# Patient Record
Sex: Male | Born: 1970
Health system: Southern US, Community
[De-identification: ages and names within clinical notes are randomized; demographics above are authoritative.]

## PROBLEM LIST (undated history)

## (undated) DIAGNOSIS — R002 Palpitations: Secondary | ICD-10-CM

## (undated) DIAGNOSIS — R42 Dizziness and giddiness: Secondary | ICD-10-CM

## (undated) DIAGNOSIS — M25562 Pain in left knee: Secondary | ICD-10-CM

## (undated) DIAGNOSIS — G4730 Sleep apnea, unspecified: Secondary | ICD-10-CM

## (undated) HISTORY — DX: Pain in left knee: M25.562

## (undated) HISTORY — DX: Dizziness and giddiness: R42

## (undated) HISTORY — PX: INGUINAL HERNIA REPAIR: SUR1180

## (undated) HISTORY — PX: NASAL SEPTUM SURGERY: SHX37

## (undated) HISTORY — DX: Sleep apnea, unspecified: G47.30

## (undated) HISTORY — DX: Palpitations: R00.2

---

## 2004-04-25 ENCOUNTER — Ambulatory Visit: Payer: Self-pay | Admitting: Internal Medicine

## 2004-04-26 ENCOUNTER — Ambulatory Visit: Payer: Self-pay | Admitting: Internal Medicine

## 2004-04-29 ENCOUNTER — Ambulatory Visit: Payer: Self-pay | Admitting: Internal Medicine

## 2005-04-26 ENCOUNTER — Ambulatory Visit: Payer: Self-pay | Admitting: General Practice

## 2005-06-09 ENCOUNTER — Ambulatory Visit: Payer: Self-pay | Admitting: Unknown Physician Specialty

## 2005-09-18 ENCOUNTER — Ambulatory Visit: Payer: Self-pay | Admitting: General Practice

## 2007-07-13 ENCOUNTER — Ambulatory Visit: Payer: Self-pay | Admitting: Internal Medicine

## 2007-08-21 ENCOUNTER — Ambulatory Visit: Payer: Self-pay | Admitting: Internal Medicine

## 2008-10-06 ENCOUNTER — Ambulatory Visit: Payer: Self-pay | Admitting: Internal Medicine

## 2008-10-09 ENCOUNTER — Ambulatory Visit: Payer: Self-pay | Admitting: Internal Medicine

## 2008-10-21 ENCOUNTER — Ambulatory Visit: Payer: Self-pay | Admitting: Family Medicine

## 2009-07-02 ENCOUNTER — Ambulatory Visit: Payer: Self-pay | Admitting: Internal Medicine

## 2011-03-07 ENCOUNTER — Ambulatory Visit: Payer: Self-pay | Admitting: Podiatry

## 2013-04-22 ENCOUNTER — Emergency Department: Payer: Self-pay | Admitting: Emergency Medicine

## 2013-04-22 LAB — BASIC METABOLIC PANEL
BUN: 14 mg/dL (ref 7–18)
Calcium, Total: 9 mg/dL (ref 8.5–10.1)
Creatinine: 1.25 mg/dL (ref 0.60–1.30)
EGFR (African American): >60
Glucose: 137 mg/dL — ABNORMAL HIGH (ref 65–99)
Osmolality: 278 (ref 275–301)
Potassium: 3.7 mmol/L (ref 3.5–5.1)

## 2013-04-22 LAB — CBC
HGB: 14.4 g/dL (ref 13.0–18.0)
MCV: 92 fL (ref 80–100)
WBC: 8.2 10*3/uL (ref 3.8–10.6)

## 2013-04-22 LAB — TROPONIN I: Troponin-I: <0.02 ng/mL

## 2013-04-23 LAB — TROPONIN I: Troponin-I: <0.02 ng/mL

## 2013-06-20 ENCOUNTER — Ambulatory Visit: Payer: Self-pay | Admitting: Podiatry

## 2013-09-14 ENCOUNTER — Ambulatory Visit: Payer: Self-pay | Admitting: Physician Assistant

## 2013-09-14 LAB — RAPID INFLUENZA A&B ANTIGENS

## 2014-02-27 DIAGNOSIS — G4730 Sleep apnea, unspecified: Secondary | ICD-10-CM | POA: Insufficient documentation

## 2014-02-27 DIAGNOSIS — E785 Hyperlipidemia, unspecified: Secondary | ICD-10-CM | POA: Insufficient documentation

## 2014-02-27 DIAGNOSIS — K219 Gastro-esophageal reflux disease without esophagitis: Secondary | ICD-10-CM | POA: Insufficient documentation

## 2014-08-23 IMAGING — CR DG CHEST 2V
1 series · 2 of 2 positions shown · non-contrast
Comparison: none

REASON FOR EXAM: Chest Pain
COMMENTS:

PROCEDURE:     DXR - DXR CHEST PA (OR AP) AND LATERAL  - April 22, 2013  [DATE]
RESULT:     The lungs are clear. The cardiac silhouette and visualized bony
skeleton are unremarkable.

[Series 1: w chest pa · 0.14mm/px · 2 of 2 slices shown]
[im 1/2]
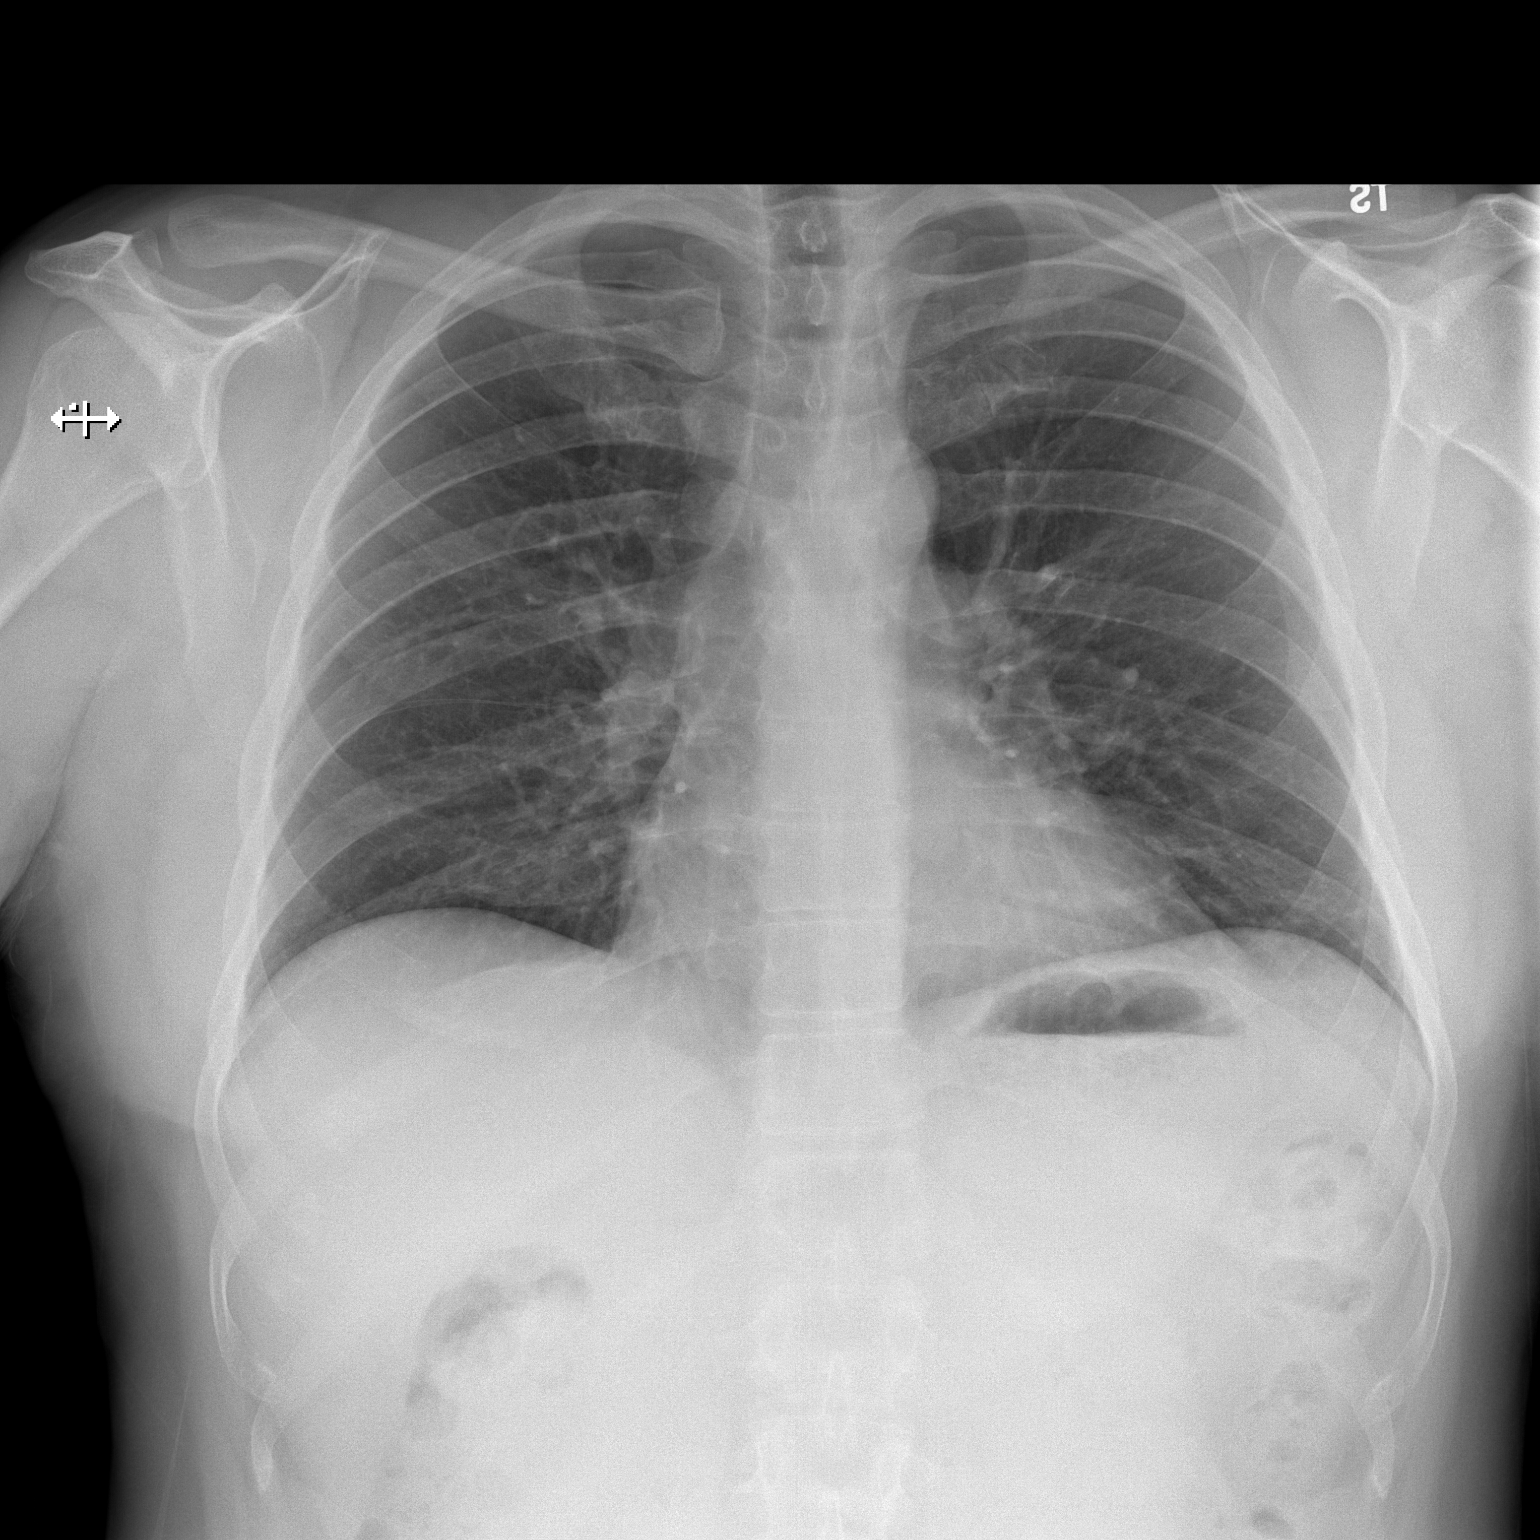
[im 2/2]
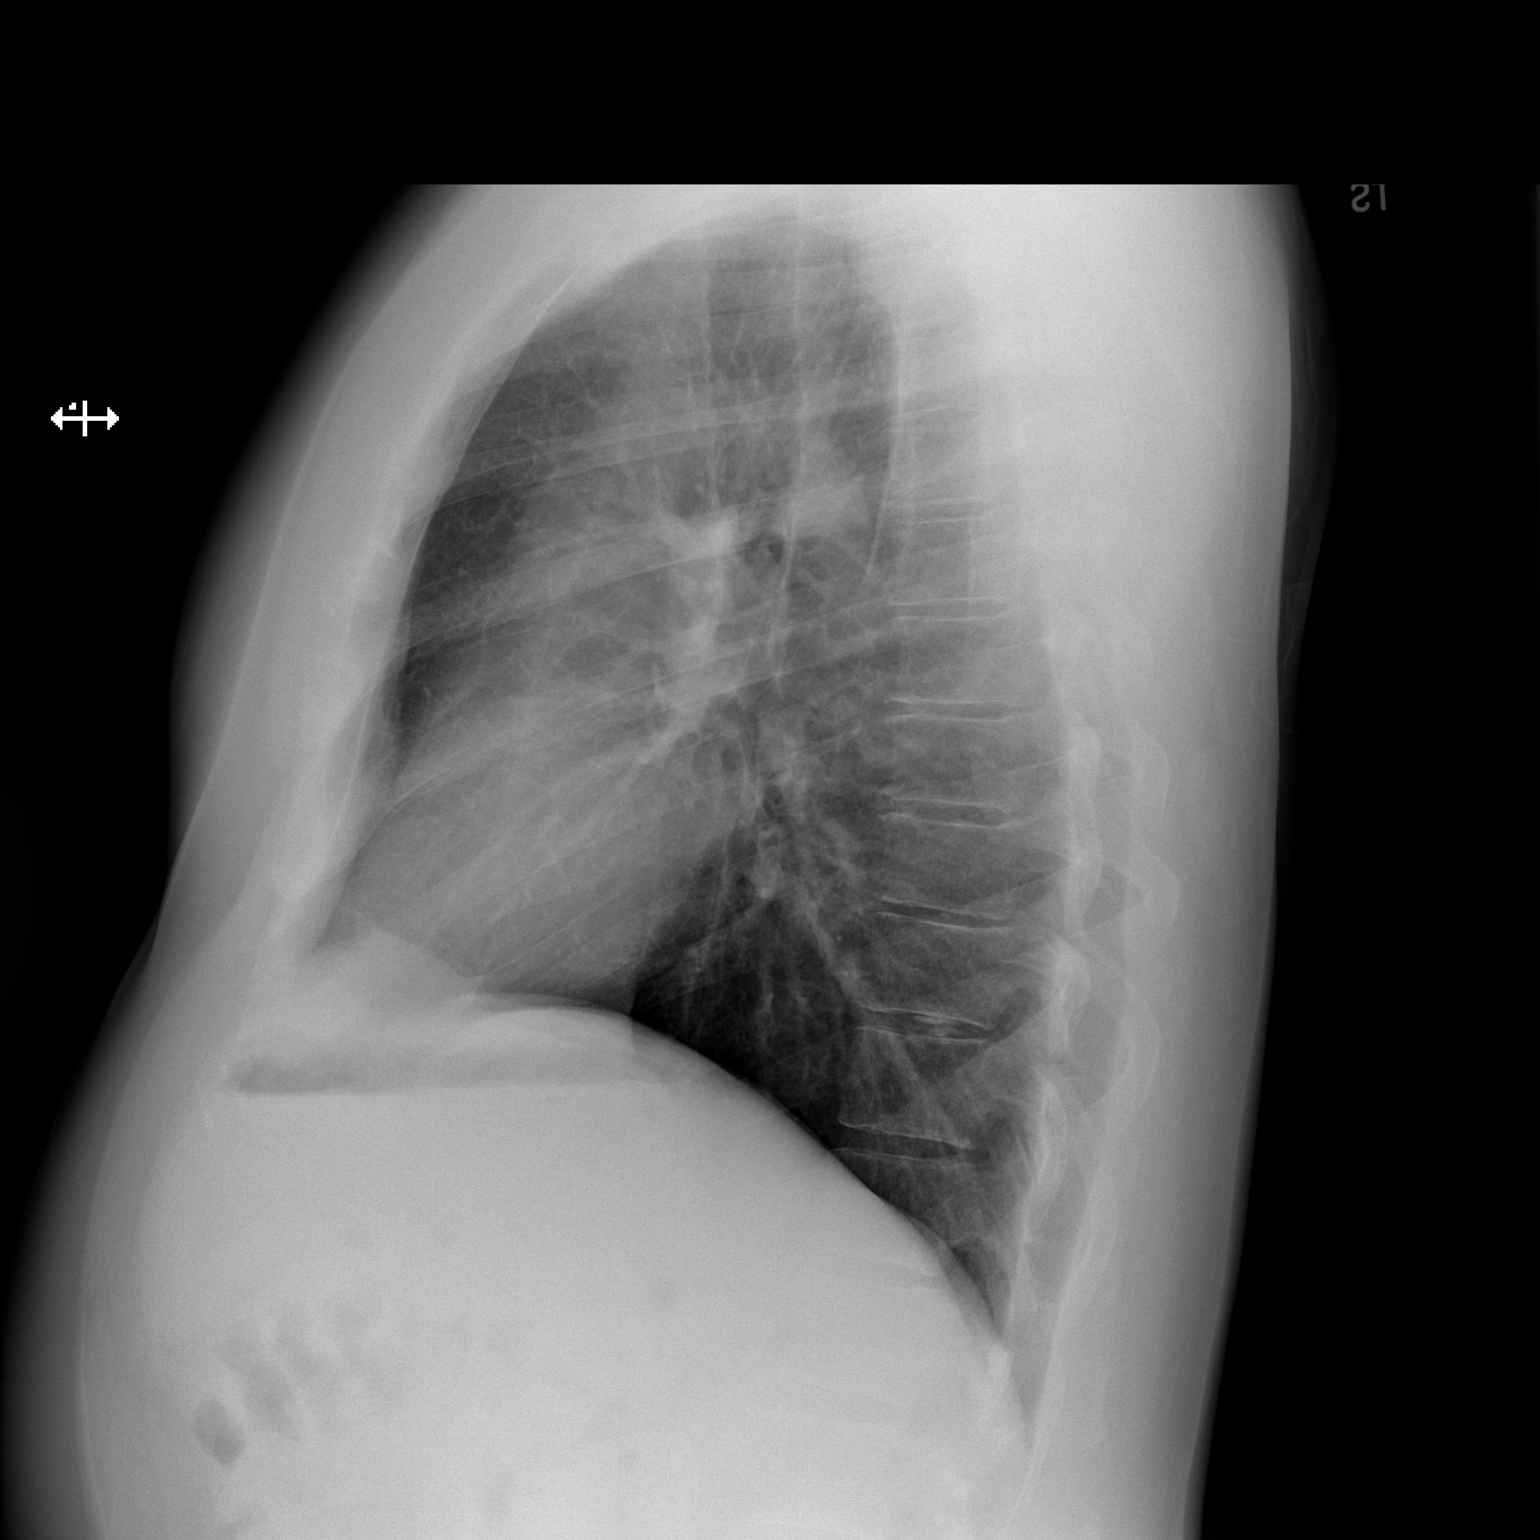

[2 of 2 positions shown; findings below may reference images not displayed]

IMPRESSION: 1. Chest radiograph without evidence of acute cardiopulmonary disease.

## 2015-11-08 ENCOUNTER — Other Ambulatory Visit: Payer: Self-pay | Admitting: Podiatry

## 2015-11-08 DIAGNOSIS — M25872 Other specified joint disorders, left ankle and foot: Secondary | ICD-10-CM

## 2015-11-26 ENCOUNTER — Ambulatory Visit
Admission: RE | Admit: 2015-11-26 | Discharge: 2015-11-26 | Disposition: A | Discharge status: Home / Self Care | Payer: BLUE CROSS/BLUE SHIELD | Source: Ambulatory Visit | Attending: Podiatry | Admitting: Podiatry

## 2015-11-26 DIAGNOSIS — M25872 Other specified joint disorders, left ankle and foot: Secondary | ICD-10-CM

## 2015-11-26 DIAGNOSIS — M7752 Other enthesopathy of left foot: Secondary | ICD-10-CM | POA: Diagnosis not present

## 2017-03-28 IMAGING — MR MR ANKLE*L* W/O CM
5 series · 40 of 40 positions shown · non-contrast
Comparison: None.

CLINICAL DATA: Lateral ankle pain and swelling since a twisting
injury in July 2015. Ankle impingement syndrome.

EXAM:
MRI OF THE LEFT ANKLE WITHOUT CONTRAST
TECHNIQUE: Multiplanar, multisequence MR imaging of the ankle was performed. No
intravenous contrast was administered.

[Series 3: PD fat-sat · axial · 3.0mm · 0.50mm/px · z∈[-115,+8]mm · 10 of 38 slices shown]
[im 1/38]
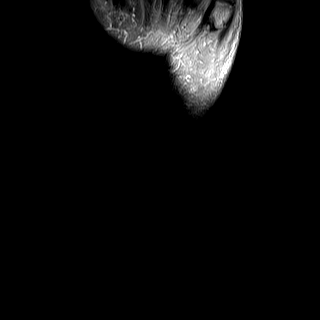
[im 5/38]
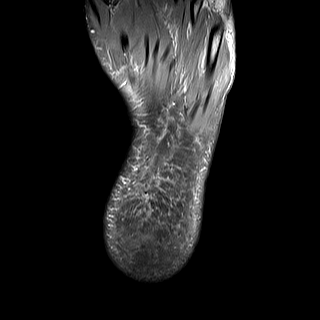
[im 9/38]
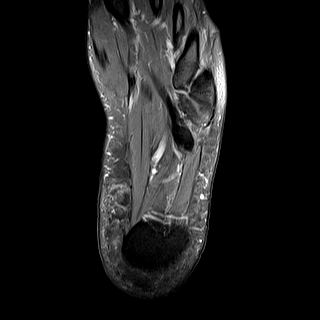
[im 13/38]
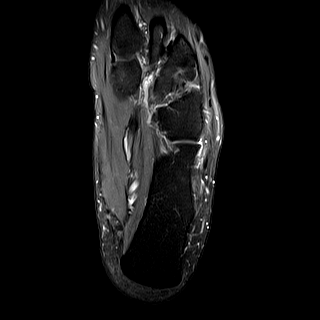
[im 17/38]
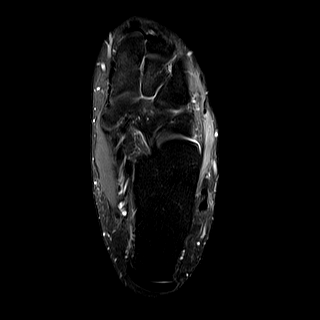
[im 21/38]
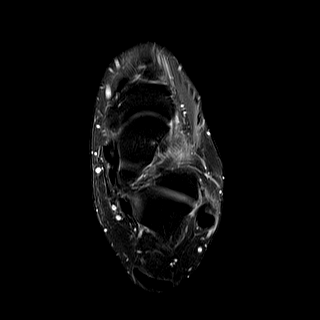
[im 25/38]
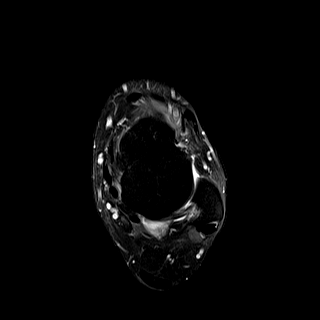
[im 29/38]
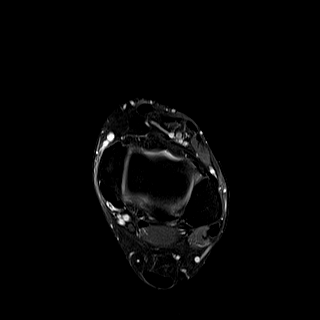
[im 33/38]
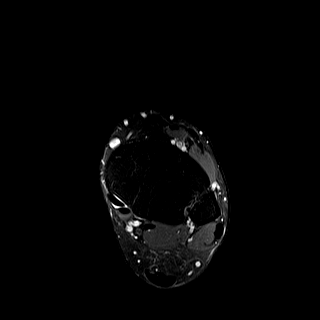
[im 38/38]
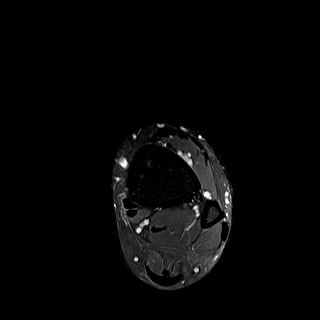

[Series 4: T2 fat-sat · axial · 3.0mm · 0.50mm/px · z∈[-115,+8]mm · 9 of 38 slices shown (1 of 3)]
[im 1/38]
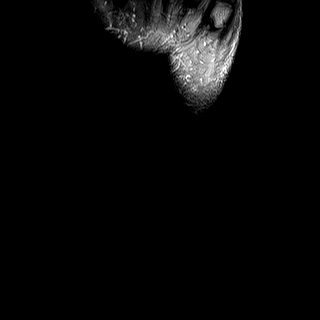
[im 5/38]
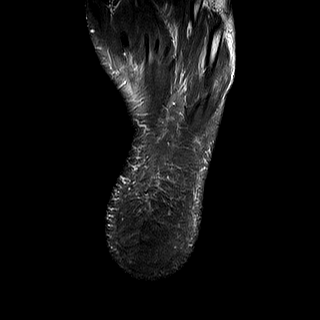
[im 10/38]
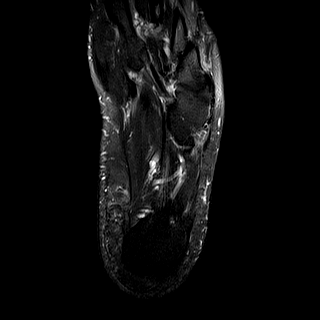
[im 14/38]
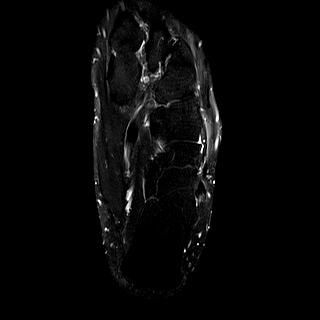
[im 19/38]
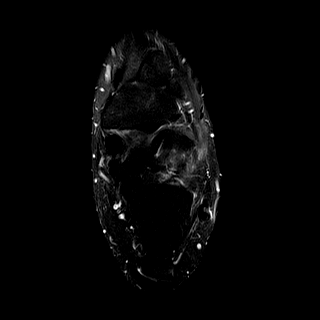
[im 24/38]
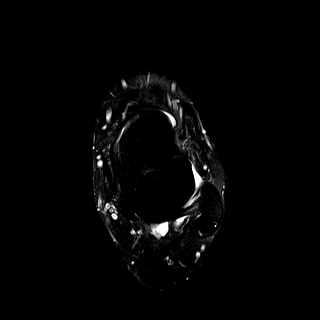
[im 28/38]
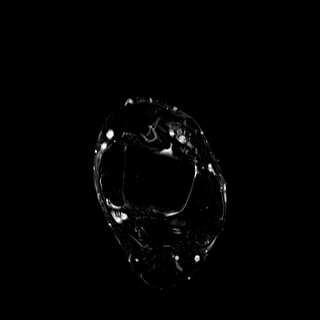
[im 33/38]
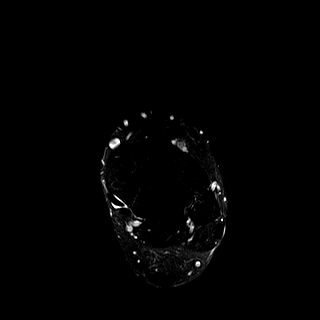
[im 38/38]
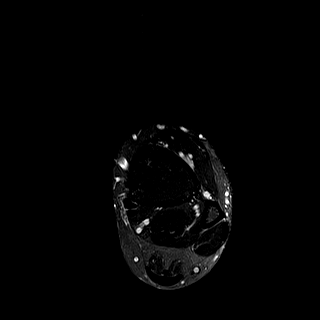

[Series 5: T2 fat-sat · coronal · 3.0mm · 0.56mm/px · 9 of 40 slices shown (2 of 3)]
[im 1/40]
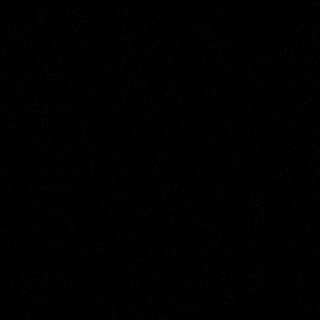
[im 5/40]
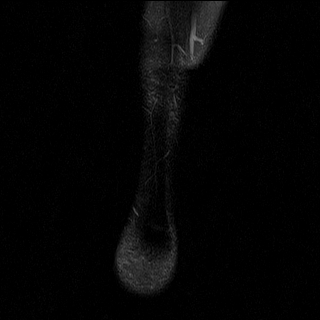
[im 10/40]
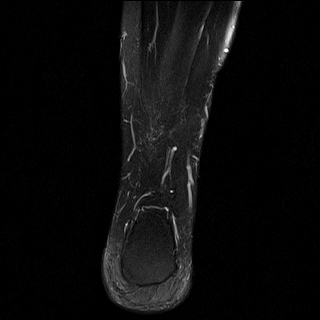
[im 15/40]
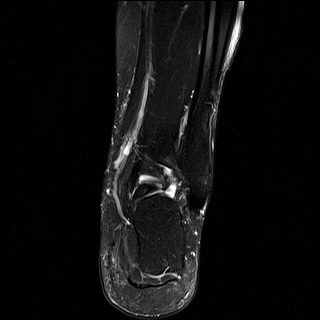
[im 20/40]
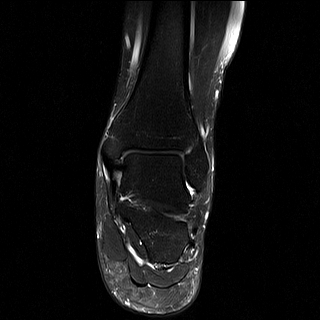
[im 25/40]
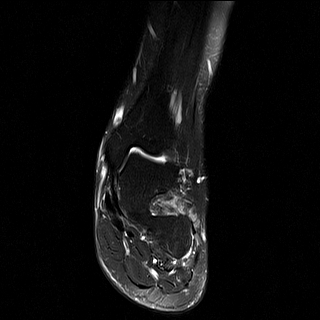
[im 30/40]
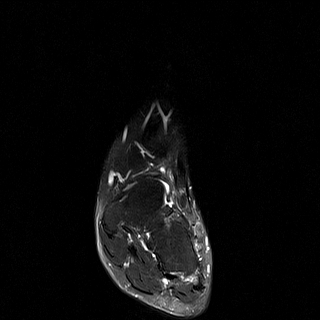
[im 35/40]
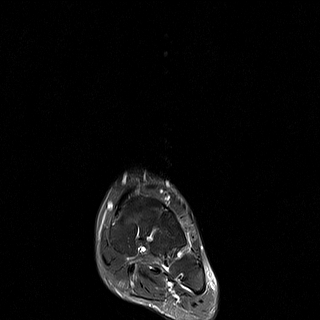
[im 40/40]
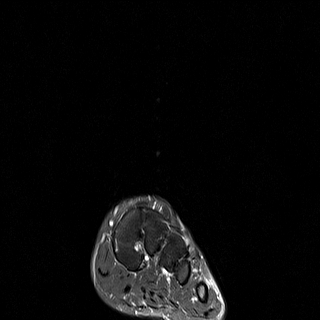

[Series 6: T1 · sagittal · 3.0mm · 0.47mm/px · 6 of 27 slices shown]
[im 1/27]
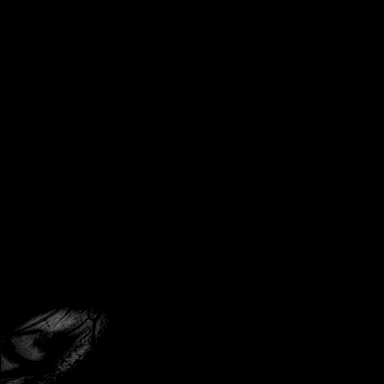
[im 6/27]
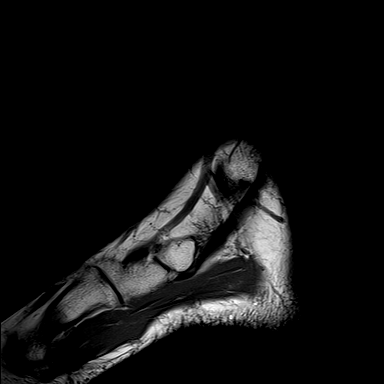
[im 11/27]
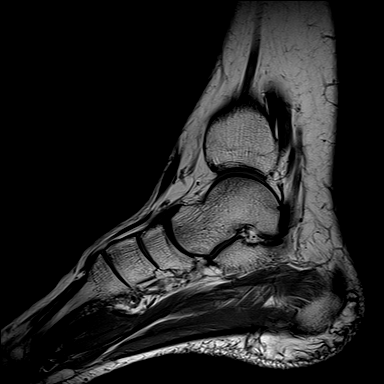
[im 16/27]
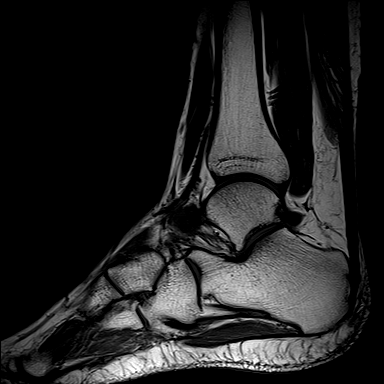
[im 21/27]
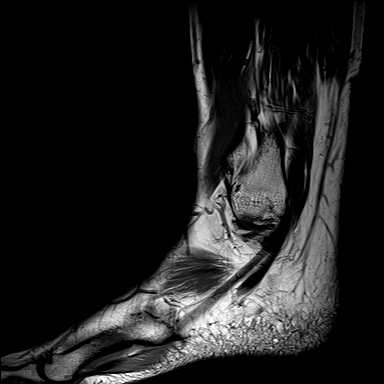
[im 27/27]
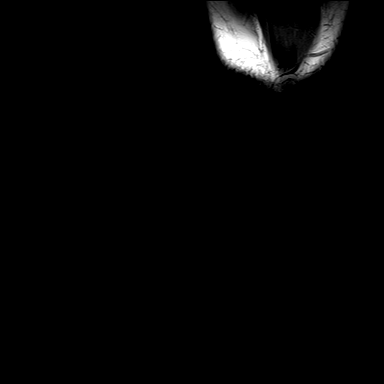

[Series 7: T2 fat-sat · sagittal · 3.0mm · 0.56mm/px · 6 of 27 slices shown (3 of 3)]
[im 1/27]
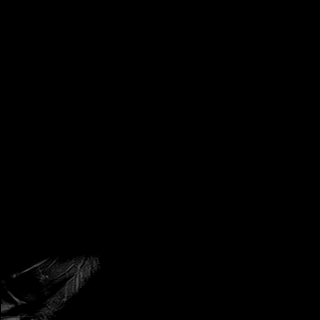
[im 6/27]
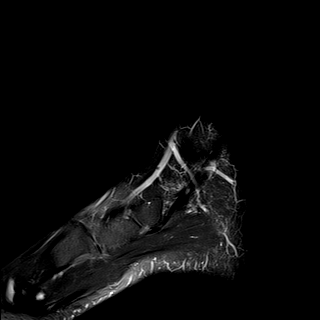
[im 11/27]
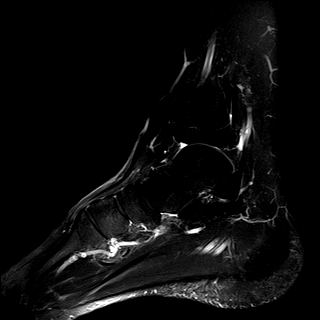
[im 16/27]
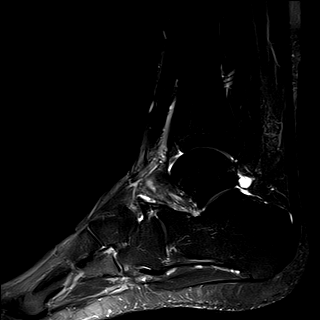
[im 21/27]
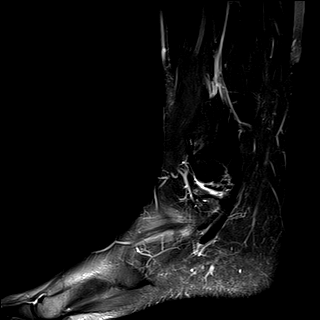
[im 27/27]
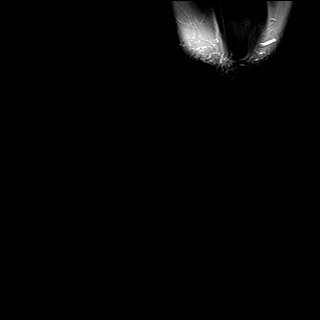

[40 of 40 positions shown; findings below may reference images not displayed]

FINDINGS: The patient has edema in the extensor digitorum brevis muscle at the
dorsal lateral aspect of the foot extending distally over the
metatarsals. This edema extends from the origin on the calcaneus.
This could be due to a strain or blunt trauma.

TENDONS

Peroneal: Normal.

Posteromedial: Normal.

Anterior: Normal.

Achilles: Normal.

Plantar Fascia: Normal.

LIGAMENTS

Lateral: Normal.

Medial: Normal.

CARTILAGE

Ankle Joint: Normal.

Subtalar Joints/Sinus Tarsi: Normal.

Bones: Minimal dorsal spurring at the talonavicular joint.
IMPRESSION: Edema of the extensor digitorum brevis muscle at the dorsal lateral
aspect of the foot possibly due to a strain. Nerve injury could also
give this appearance and should be considered given the remote
nature of the patient's reported injury.

## 2017-04-10 ENCOUNTER — Ambulatory Visit: Payer: BLUE CROSS/BLUE SHIELD | Attending: Family Medicine | Admitting: Physical Therapy

## 2017-04-10 DIAGNOSIS — M25562 Pain in left knee: Secondary | ICD-10-CM

## 2017-04-10 NOTE — Patient Instructions (Signed)
Squat assessed- no pain, no valgus collapse, good technique   MMT - WNL for both sides with no pain   Valgus/varus stress testing - negative at 0 and 30 degrees of flexion   Abductors/adductors - WNL   CPAs on the spine - no pain  Ely's- WNL bilaterally   Prone hip flexion test - WNL bilaterally   Palpation - he reports pain is just lateral to patellar tendon   Thessaly test - positive on L with 5 degrees of bend, not 30 degrees   Single leg sit to stand , unable to complete bilaterally but R can elevate   Hip IR/ER ROM -- IR slightly limited

## 2017-04-10 NOTE — Therapy (Signed)
[place] [facility]ICINE [address] [place], [address] Phone: [phone]   Fax:  [phone]  Physical Therapy Evaluation  Patient Details  Name: Cody [name] MRN: [id number] Date of Birth: Mar 03, 1971 Referring Provider: Dr. Ellin Goodie  Encounter Date: 04/10/2017      PT End of Session - 04/10/17 1153    Visit Number 1   Number of Visits 9   Date for PT Re-Evaluation 05/22/17   PT Start Time 1035   PT Stop Time 1137   PT Time Calculation (min) 62 min   Activity Tolerance Patient tolerated treatment well   Behavior During Therapy [facility] for tasks assessed/performed      No past medical history on file.  No past surgical history on file.  There were no vitals filed for this visit.       Subjective Assessment - 04/10/17 1039    Subjective Patient reports the first week of July we was twisting to get into bed he felt a "pop". He reports the pain was severe at first, but he still gets a sensation of stabbing, knife like pain in the infero-lateral pain. He still has residual numbness/tingling in that area. He is unable to put weight onto his knee (like gardening position). He reports he has had more time in between onset of pain, duration is roughly the same but the intensity is not what it was. He had a constant dull achey pain at rest, in addition to the sharp pain. He has had a sprained R ankle in the last 2 years as well. No other injuries of significance or surgeries. He reports the pain is very unpredictable. No definable reproducible mechanism.    Limitations Lifting;House hold activities;Walking   Diagnostic tests X-rays pending    Patient Stated Goals To get rid of his knee pain    Currently in Pain? Other (Comment)  Patient reports he is aware of something different in his L knee.            [facility] PT Assessment - 04/10/17 1205      Assessment   Medical Diagnosis Lateral meniscus tear vs LCL sprain    Referring Provider Dr. Ellin Goodie   Onset Date/Surgical Date --  First week of July     Precautions   Precautions None     Restrictions   Weight Bearing Restrictions No     Balance Screen   Has the patient fallen in the past 6 months No   Has the patient had a decrease in activity level because of a fear of falling?  Yes   Is the patient reluctant to leave their home because of a fear of falling?  No     Home [employer] residence   Living Arrangements Spouse/significant other;Children     Prior Function   Level of Independence Independent   Vocation Full time employment   Vocation Requirements Works with [employer], plays with teenage sons     Cognition   Overall Cognitive Status Within Functional Limits for tasks assessed     Observation/Other Assessments   Lower Extremity Functional Scale  72     Observation/Other Assessments-Edema    Edema --  No palpable edema     Sensation   Light Touch Appears Intact  Mild decrease around infero-lateral corner of ant. knee       Squat assessed- no pain, no valgus collapse, good technique  MMT - WNL for both sides with no pain   Valgus/varus stress testing - negative at 0 and 30 degrees of flexion   Abductors/adductors - WNL   CPAs on the spine - no pain  Ely's- WNL bilaterally   Prone hip flexion test - WNL bilaterally   Palpation - he reports pain is just lateral to patellar tendon   Thessaly test - positive on L with 5 degrees of bend laterally, not 30 degrees   Single leg sit to stand , unable to complete bilaterally but R can elevate   Hip IR/ER ROM -- IR slightly limited   Patellar mobilizations - WNL no pain    TherEx TRX single leg sit to stand (reports he felt pain over lateral knee)  Standing hip abductions on blue side up of BOSU x 12 per side with min A from 1 finger for balance  Hip abductions with green and blue t-band x 12 per side  Single leg  bridging with hip abductions x 8 per side for 2 sets bilaterally with hip abductions.    Objective measurements completed on examination: See above findings.                  PT Education - 04/10/17 1152    Education provided Yes   Education Details That he did test positive for a meniscal tear test, but these are common and not necessarily indicative of why he is having pain.    Person(s) Educated Patient   Methods Explanation;Demonstration;Verbal cues;Handout   Comprehension Verbalized understanding;Returned demonstration;Verbal cues required             PT Long Term Goals - 04/10/17 1154      PT LONG TERM GOAL #1   Title Patient will report LEFS of 80/80 to demonstrate return to prior level of function.    Baseline 72/80   Time 6   Period Weeks   Status New   Target Date 05/22/17     PT LONG TERM GOAL #2   Title Patient will report no pain with ADLs or work related activities to return to prior level of function.    Baseline Intermittent L knee pain up to an 8/10   Time 6   Period Weeks   Status New   Target Date 05/22/17     PT LONG TERM GOAL #3   Title Patient will not report pain with single leg TRX sit to stand or Thessaly test to demonstrate improved tolerance for stressful positions.    Baseline Pain at eval with both    Time 6   Period Weeks   Status New   Target Date 05/22/17                Plan - 04/10/17 1157    Clinical Impression Statement Patient presents with acute L knee pain after experiencing a "pop" with a twisting activity. His symptoms have progressively declined since that episode, however he still gets intermittent sharp pain. His symptoms today are provoked with loaded deep single leg squat on TRX and Thessaly testing laterally. His major knee ligaments tested well with no laxity or pain during testing. His main deficit now appears to be inability to tolerate twisting motions on L knee, plan of care will focus on  proprioceptive deficits associated with knee injuries as well as deriving movements from hip for rotations, not the knee joint to minimize loading of lateral meniscus.    Clinical Presentation Evolving   Clinical Decision Making  Moderate   Rehab Potential Good   PT Frequency 1x / week   PT Duration 8 weeks   PT Treatment/Interventions Aquatic Therapy;Moist Heat;Stair training;Gait training;Iontophoresis /ml Dexamethasone;Neuromuscular re-education;Dry needling;Manual techniques;Therapeutic exercise;Therapeutic activities;Balance training;Electrical Stimulation;Cryotherapy;Patient/family education   PT Next Visit Plan Provide HEP for rotational movements, check knee extension ROM relative to R    PT Home Exercise Plan SIngle leg bridging, hip abductions on BOSU, hip abductions with green and blue t-band.    Consulted and Agree with Plan of Care Patient      Patient will benefit from skilled therapeutic intervention in order to improve the following deficits and impairments:  Pain, Decreased strength, Difficulty walking, Decreased balance  Visit Diagnosis: Acute pain of left knee - Plan: PT plan of care cert/re-cert     Problem List There are no active problems to display for this patient.  Alva Garnet PT, DPT, CSCS    04/10/2017, 12:09 PM  [place] [facility] PHYSICAL AND SPORTS MEDICINE [address] [place], [address] Phone: [phone]   Fax:  [phone]  Name: Cody [name] MRN: [id number] Date of Birth: September 06, 1970

## 2017-04-12 ENCOUNTER — Ambulatory Visit: Payer: BLUE CROSS/BLUE SHIELD | Admitting: Physical Therapy

## 2017-04-23 ENCOUNTER — Other Ambulatory Visit: Payer: Self-pay | Admitting: Family Medicine

## 2017-04-23 ENCOUNTER — Ambulatory Visit
Admission: RE | Admit: 2017-04-23 | Discharge: 2017-04-23 | Disposition: A | Discharge status: Home / Self Care | Payer: BLUE CROSS/BLUE SHIELD | Source: Ambulatory Visit | Attending: Family Medicine | Admitting: Family Medicine

## 2017-04-23 ENCOUNTER — Encounter: Payer: BLUE CROSS/BLUE SHIELD | Admitting: Physical Therapy

## 2017-04-23 DIAGNOSIS — R52 Pain, unspecified: Secondary | ICD-10-CM

## 2017-04-23 DIAGNOSIS — M25562 Pain in left knee: Secondary | ICD-10-CM | POA: Insufficient documentation

## 2017-04-25 ENCOUNTER — Ambulatory Visit: Payer: BLUE CROSS/BLUE SHIELD | Attending: Family Medicine | Admitting: Physical Therapy

## 2017-04-25 DIAGNOSIS — M25562 Pain in left knee: Secondary | ICD-10-CM | POA: Diagnosis not present

## 2017-04-25 NOTE — Therapy (Signed)
[place] [facility]TS [facility]IC[facility] [address], [place], [address] Phone: [phone]   Fax:  [phone]  Physical Therapy Treatment  Patient Details  Name: Cody [name] MRN: [id number] Date of Birth: [birth year] Referring Provider: Dr. Ellin Goodie  Encounter Date: 04/25/2017      PT End of Session - 04/25/17 1411    Visit Number 2   Number of Visits 9   Date for PT Re-Evaluation 05/22/17   PT Start Time 1344   PT Stop Time 1401   PT Time Calculation (min) 17 min   Activity Tolerance Patient tolerated treatment well   Behavior During Therapy [facility] for tasks assessed/performed      No past medical history on file.  No past surgical history on file.  There were no vitals filed for this visit.      Subjective Assessment - 04/25/17 1410    Subjective Patient reports he was able to go hiking and perform his normal work duties, he has had occasional spurts of stiffness/locking in the knee but they resolve and are not painful.    Limitations Lifting;House hold activities;Walking   Diagnostic tests X-rays pending    Patient Stated Goals To get rid of his knee pain    Currently in Pain? No/denies        L stance Medial - 107  Lateral - 87 Anterior - 64  R stance  Medial - 104 Lateral - 86  Anterior - 64  Single leg hop for distance -87 cm   Triple hop L - 143 cm R - 141 cm  Thessaly negaitve at both 5 and 30 degrees   Varus/valgus testing at 0 and 30 degrees -- no pain  Anterior/posterior drawer testing - solid end feel with no pain                          PT Education - 04/25/17 1411    Education provided Yes   Education Details His RTS testing was WNL, will discharge since he is not having the acute bout of knee pain he was having.    Person(s) Educated Patient   Methods Explanation;Demonstration   Comprehension Verbalized understanding;Returned demonstration              PT Long Term Goals - 04/25/17 1412      PT LONG TERM GOAL #1   Title Patient will report LEFS of 80/80 to demonstrate return to prior level of function.    Baseline 72/80   Time 6   Period Weeks   Status On-going     PT LONG TERM GOAL #2   Title Patient will report no pain with ADLs or work related activities to return to prior level of function.    Baseline Intermittent L knee pain up to an 8/10   Time 6   Period Weeks   Status Achieved     PT LONG TERM GOAL #3   Title Patient will not report pain with single leg TRX sit to stand or Thessaly test to demonstrate improved tolerance for stressful positions.    Baseline Pain at eval with both    Time 6   Period Weeks   Status On-going               Plan - 04/25/17 1411    Clinical Impression Statement Patient reports significant improvement in his symptoms. He passed all RTS testing this date, <4cm differences in  Y-star balance, < 10% differences in hop testing, no pain with ligamentous testing. Given his significant improvement, he will be discharged, which he agreed to.    Clinical Presentation Stable   Clinical Decision Making Low   Rehab Potential Good   PT Frequency 1x / week   PT Duration 8 weeks   PT Treatment/Interventions Aquatic Therapy;Moist Heat;Stair training;Gait training;Iontophoresis /ml Dexamethasone;Neuromuscular re-education;Dry needling;Manual techniques;Therapeutic exercise;Therapeutic activities;Balance training;Electrical Stimulation;Cryotherapy;Patient/family education   PT Next Visit Plan Provide HEP for rotational movements, check knee extension ROM relative to R    PT Home Exercise Plan SIngle leg bridging, hip abductions on BOSU, hip abductions with green and blue t-band.    Consulted and Agree with Plan of Care Patient      Patient will benefit from skilled therapeutic intervention in order to improve the following deficits and impairments:  Pain, Decreased strength, Difficulty walking,  Decreased balance  Visit Diagnosis: Acute pain of left knee     Problem List There are no active problems to display for this patient.  Alva Garnet PT, DPT, CSCS    04/25/2017, 2:15 PM  [facility] PHYSICAL AND SPORTS MEDICINE [address], [place], [address] Phone: [phone]   Fax:  [phone]  Name: Cody [name] MRN: [id number] Date of Birth: 1971-04-01

## 2017-04-25 NOTE — Patient Instructions (Signed)
L stance Medial - 107  Lateral - 87 Anterior - 64  R stance  Medial - 104 Lateral - 86  Anterior - 64  Single leg hop for distance -87 cm   Triple hop L - 143 cm R - 141 cm  Thessaly negaitve at both 5 and 30 degrees   Varus/valgus testing at 0 and 30 degrees -- no pain  Anterior/posterior drawer testing - solid end feel with no pain

## 2017-05-02 ENCOUNTER — Encounter: Payer: BLUE CROSS/BLUE SHIELD | Admitting: Physical Therapy

## 2017-05-09 ENCOUNTER — Encounter: Payer: BLUE CROSS/BLUE SHIELD | Admitting: Physical Therapy

## 2018-04-02 DIAGNOSIS — S93409A Sprain of unspecified ligament of unspecified ankle, initial encounter: Secondary | ICD-10-CM | POA: Insufficient documentation

## 2018-08-13 ENCOUNTER — Encounter: Payer: Self-pay | Admitting: Registered Nurse

## 2019-01-29 ENCOUNTER — Other Ambulatory Visit: Payer: Self-pay

## 2019-01-29 ENCOUNTER — Encounter: Payer: Self-pay | Admitting: Internal Medicine

## 2019-01-29 ENCOUNTER — Ambulatory Visit: Payer: Self-pay | Admitting: Internal Medicine

## 2019-01-29 VITALS — BP 133/90 | HR 96 | Temp 98.1°F | Resp 16 | Ht 69.5 in | Wt 213.0 lb

## 2019-01-29 DIAGNOSIS — F17200 Nicotine dependence, unspecified, uncomplicated: Secondary | ICD-10-CM

## 2019-01-29 MED ORDER — BUPROPION HCL ER (SMOKING DET) 150 MG PO TB12: 150.0000 mg | ORAL_TABLET | Freq: Two times a day (BID) | ORAL | 1 refills | Status: DC

## 2019-01-29 NOTE — Patient Instructions (Signed)
Bupropion sustained-release tablets (smoking cessation) What is this medicine? BUPROPION (byoo PROE pee on) is used to help people quit smoking. This medicine may be used for other purposes; ask your health care provider or pharmacist if you have questions. COMMON BRAND NAME(S): Buproban, Zyban What should I tell my health care provider before I take this medicine? They need to know if you have any of these conditions:  an eating disorder, such as anorexia or bulimia  bipolar disorder or psychosis  diabetes or high blood sugar, treated with medication  glaucoma  head injury or brain tumor  heart disease, previous heart attack, or irregular heart beat  high blood pressure  kidney or liver disease  seizures  suicidal thoughts or a previous suicide attempt  Tourette's syndrome  weight loss  an unusual or allergic reaction to bupropion, other medicines, foods, dyes, or preservatives  breast-feeding  pregnant or trying to become pregnant How should I use this medicine? Take this medicine by mouth with a glass of water. Follow the directions on the prescription label. You can take it with or without food. If it upsets your stomach, take it with food. Do not cut, crush or chew this medicine. Take your medicine at regular intervals. If you take this medicine more than once a day, take your second dose at least 8 hours after you take your first dose. To limit difficulty in sleeping, avoid taking this medicine at bedtime. Do not take your medicine more often than directed. Do not stop taking this medicine suddenly except upon the advice of your doctor. Stopping this medicine too quickly may cause serious side effects. A special MedGuide will be given to you by the pharmacist with each prescription and refill. Be sure to read this information carefully each time. Talk to your pediatrician regarding the use of this medicine in children. Special care may be needed. Overdosage: If you  think you have taken too much of this medicine contact a poison control center or emergency room at once. NOTE: This medicine is only for you. Do not share this medicine with others. What if I miss a dose? If you miss a dose, skip the missed dose and take your next tablet at the regular time. There should be at least 8 hours between doses. Do not take double or extra doses. What may interact with this medicine? Do not take this medicine with any of the following medications:  linezolid  MAOIs like Azilect, Carbex, Eldepryl, Marplan, Nardil, and Parnate  methylene blue (injected into a vein)  other medicines that contain bupropion like Wellbutrin This medicine may also interact with the following medications:  alcohol  certain medicines for anxiety or sleep  certain medicines for blood pressure like metoprolol, propranolol  certain medicines for depression or psychotic disturbances  certain medicines for HIV or AIDS like efavirenz, lopinavir, nelfinavir, ritonavir  certain medicines for irregular heart beat like propafenone, flecainide  certain medicines for Parkinson's disease like amantadine, levodopa  certain medicines for seizures like carbamazepine, phenytoin, phenobarbital  cimetidine  clopidogrel  cyclophosphamide  digoxin  furazolidone  isoniazid  nicotine  orphenadrine  procarbazine  steroid medicines like prednisone or cortisone  stimulant medicines for attention disorders, weight loss, or to stay awake  tamoxifen  theophylline  thiotepa  ticlopidine  tramadol  warfarin This list may not describe all possible interactions. Give your health care provider a list of all the medicines, herbs, non-prescription drugs, or dietary supplements you use. Also tell them if you smoke,   drink alcohol, or use illegal drugs. Some items may interact with your medicine. What should I watch for while using this medicine? Visit your doctor or healthcare provider  for regular checks on your progress. This medicine should be used together with a patient support program. It is important to participate in a behavioral program, counseling, or other support program that is recommended by your healthcare provider. This medicine may cause serious skin reactions. They can happen weeks to months after starting the medicine. Contact your healthcare provider right away if you notice fevers or flu-like symptoms with a rash. The rash may be red or purple and then turn into blisters or peeling of the skin. Or, you might notice a red rash with swelling of the face, lips or lymph nodes in your neck or under your arms. Patients and their families should watch out for new or worsening thoughts of suicide or depression. Also watch out for sudden changes in feelings such as feeling anxious, agitated, panicky, irritable, hostile, aggressive, impulsive, severely restless, overly excited and hyperactive, or not being able to sleep. If this happens, especially at the beginning of treatment or after a change in dose, call your healthcare provider. Avoid alcoholic drinks while taking this medicine. Drinking excessive alcoholic beverages, using sleeping or anxiety medicines, or quickly stopping the use of these agents while taking this medicine may increase your risk for a seizure. Do not drive or use heavy machinery until you know how this medicine affects you. This medicine can impair your ability to perform these tasks. Do not take this medicine close to bedtime. It may prevent you from sleeping. Your mouth may get dry. Chewing sugarless gum or sucking hard candy, and drinking plenty of water may help. Contact your doctor if the problem does not go away or is severe. Do not use nicotine patches or chewing gum without the advice of your doctor or healthcare provider while taking this medicine. You may need to have your blood pressure taken regularly if your doctor recommends that you use both  nicotine and this medicine together. What side effects may I notice from receiving this medicine? Side effects that you should report to your doctor or health care professional as soon as possible:  allergic reactions like skin rash, itching or hives, swelling of the face, lips, or tongue  breathing problems  changes in vision  confusion  elevated mood, decreased need for sleep, racing thoughts, impulsive behavior  fast or irregular heartbeat  hallucinations, loss of contact with reality  increased blood pressure  rash, fever, and swollen lymph nodes  redness, blistering, peeling, or loosening of the skin, including inside the mouth  seizures  suicidal thoughts or other mood changes  unusually weak or tired  vomiting Side effects that usually do not require medical attention (report to your doctor or health care professional if they continue or are bothersome):  constipation  headache  loss of appetite  nausea  tremors  weight loss This list may not describe all possible side effects. Call your doctor for medical advice about side effects. You may report side effects to FDA at 1-800-FDA-1088. Where should I keep my medicine? Keep out of the reach of children. Store at room temperature between 20 and 25 degrees C (68 and 77 degrees F). Protect from light. Keep container tightly closed. Throw away any unused medicine after the expiration date. NOTE: This sheet is a summary. It may not cover all possible information. If you have questions about this medicine,   talk to your doctor, pharmacist, or health care provider.  2020 Elsevier/Gold Standard (2018-10-03 13:59:09)  

## 2019-01-29 NOTE — Progress Notes (Signed)
S -presents to inquire about getting Wellbutrin again as it was helpful for him to quit tobacco previously.  He quit tobacco use smoking over 30 years ago, and was using chewing tobacco, had successfully quit with adding Wellbutrin twice a day years ago, and lasted about 3 years without.  He then picked up again about 2 to 3 years ago and has been using chew ever since.  He noted he got an added benefit with the Wellbutrin as his mood was better, and his wife noted that his mood was better taking the medicine as well.  He had no other more concerning side effects related to that medicine.  He states that he is motivated to quit presently.  Allergies  Allergen Reactions  . Levofloxacin Other (See Comments)  . Penicillins Other (See Comments)   Current Outpatient Medications on File Prior to Visit  Medication Sig Dispense Refill  . tadalafil (CIALIS) 20 MG tablet TAKE 1 TABLET BY MOUTH DAILY AS NEEDED FOR ED, *MAX OF 1 TAB IN 24 HRS*     No current facility-administered medications on file prior to visit.    O - NAD, masked  BP 133/90 (BP Location: Right Arm, Patient Position: Sitting, Cuff Size: Large)   Pulse 96   Temp 98.1 F (36.7 C) (Oral)   Resp 16   Ht 5' 9.5" (1.765 m)   Wt 213 lb (96.6 kg)   SpO2 96%   BMI 31.00 kg/m   HEENT-sclera were anicteric  He was very appropriate with conversation, affect was not flat, speech was not rapid.  Last labs done 08/06/2018 were reviewed, with no concerns noted on these labs.  Assessment- tobacco dependence- smokeless tobacco (chew)  Discussed at length options for tobacco cessation, I did mention Chantix as a another medicine often prescribed.  He noted he had success with the Wellbutrin previously, and would like to return to that, and also noted the added benefits from that as above.  Felt reasonable to prescribe Wellbutrin 150 mg extended release, will take 1 daily for 3 days, then twice daily for 7 to 12 weeks.  He should stop using  tobacco about a week after he starts the medicine.  He will follow-up as needed over this timeframe.  Of note, his blood pressure was borderline today with a diastolic of 90.  His last exam in January 2020 had a blood pressure 110/72.  We will continue to monitor at present.  Also of note, he has had a nail disorder which he has had for years, and a researcher stopped by his office, and noted he should get checked for a rare type of fungus.  I was not aware of any rare type of fungus that should be checked, did offer him to see a dermatologist who may have more information on this.  He noted that the researcher was going to contact him with more information, and await that presently, and if there are more concerns or ways I can help, he will let me know.  I did note that for onychomycosis, fungus infection of the nails, there are medicines that can be used to treat, taken daily often for months, and sometimes it can recur, and it seemed as if he was aware of that.

## 2019-03-06 ENCOUNTER — Encounter: Payer: Self-pay | Admitting: Internal Medicine

## 2019-03-12 DIAGNOSIS — H1045 Other chronic allergic conjunctivitis: Secondary | ICD-10-CM | POA: Diagnosis not present

## 2019-03-14 ENCOUNTER — Telehealth: Payer: Self-pay

## 2019-03-14 NOTE — Telephone Encounter (Signed)
Pt needs a new order for his CPAP supplies,

## 2019-03-24 NOTE — Telephone Encounter (Signed)
Spoke with pt and he made an appt,

## 2019-04-01 ENCOUNTER — Ambulatory Visit: Payer: 59 | Admitting: Internal Medicine

## 2019-04-01 ENCOUNTER — Encounter: Payer: Self-pay | Admitting: Internal Medicine

## 2019-04-01 ENCOUNTER — Other Ambulatory Visit: Payer: Self-pay

## 2019-04-01 VITALS — BP 131/96 | HR 104 | Temp 98.2°F | Resp 14 | Ht 69.0 in | Wt 215.0 lb

## 2019-04-01 DIAGNOSIS — R030 Elevated blood-pressure reading, without diagnosis of hypertension: Secondary | ICD-10-CM | POA: Insufficient documentation

## 2019-04-01 DIAGNOSIS — E66811 Obesity, class 1: Secondary | ICD-10-CM | POA: Insufficient documentation

## 2019-04-01 DIAGNOSIS — Z6831 Body mass index (BMI) 31.0-31.9, adult: Secondary | ICD-10-CM | POA: Insufficient documentation

## 2019-04-01 DIAGNOSIS — G4733 Obstructive sleep apnea (adult) (pediatric): Secondary | ICD-10-CM

## 2019-04-01 DIAGNOSIS — E6609 Other obesity due to excess calories: Secondary | ICD-10-CM | POA: Insufficient documentation

## 2019-04-01 DIAGNOSIS — F17200 Nicotine dependence, unspecified, uncomplicated: Secondary | ICD-10-CM | POA: Insufficient documentation

## 2019-04-01 NOTE — Progress Notes (Signed)
S -Cody [name] is a 48 year old white male who presents to talk about sleep apnea issues.  He has been using CPAP now for over 7 years, with a study done in 2014 at [facility].  The study did diagnose sleep apnea with a titration study also done and those results were sent and I have them to review.  He states he has had success with the CPAP, does help with next-day headaches, and he is in need of some supplies as his mask is not working well, and has a piece that has broke.  I noted in the chart, a note from the prior clinician here in February 2019 which stated that he did not feel the CPAP worked as well as it should and a titration study was ordered.  The Cody [name] does not recall that event, never did have another titration study.  His annual exam in January 2020 by a PA here, did not mention sleep apnea.  He very much wants to continue utilizing CPAP, and really does not feel that he is in need of another sleep study.  I did see him fairly recently for tobacco cessation, and he notes that he started the medicine and only lasted about a week, and when he got to the 2 times a day dosage, it was too much for him, and decided to stop taking the medicine altogether and not try to continue with his efforts of tobacco cessation.  It is with chew.  He notes in the last 2 weeks, it has been very stressful, and he has struggled some to sleep, has been a little better the last 2 or 3 nights, but really struggled with a week before that.  Inquired about anything to help with sleep.  He denied any history of high blood pressure, notes his blood pressures have always been very good if not on the lower side, and looking through the chart, blood pressures were good on prior exams and assessments.  He denies any recent headaches, chest pains, palpitations, shortness of breath, and no lower extremity swelling.  Current Outpatient Medications on File Prior to Visit  Medication Sig Dispense Refill  . tadalafil (CIALIS) 20 MG  tablet TAKE 1 TABLET BY MOUTH DAILY AS NEEDED FOR ED, *MAX OF 1 TAB IN 24 HRS*     No current facility-administered medications on file prior to visit.    Allergies  Allergen Reactions  . Levofloxacin Other (See Comments)  . Penicillins Other (See Comments)   O -NAD, masked  BP (!) 131/96 (BP Location: Left Arm, Cody [name] Position: Sitting, Cuff Size: Large)   Pulse (!) 104   Temp 98.2 F (36.8 C) (Oral)   Resp 14   Ht 5\' 9"  (1.753 m)   Wt 215 lb (97.5 kg)   SpO2 97%   BMI 31.75 kg/m   Recheck bp - 139/95 with machine  HEENT- sclera were anicteric, positive glasses, Neck -carotids were 2+ and equal, no bruits, Car -regular rate and rhythm without murmur Pulm -clear to auscultation Ext - no LE edema Affect was not flat, very appropriate with conversation  Sleep study results from the 2014 study at [facility] including a titration study were reviewed at length.  Ass/Plan 1.  Sleep apnea history-has done well with CPAP.  Does need help with getting it getting supplies that are damaged or broke.  Agree that he probably does not need a repeat sleep study based on our assessment today.  Did complete the prescription with the appropriate settings based on  the 2014 study ordered.  He uses a nasal type mask and not a full facemask.  They will be faxed to the appropriate place to hopefully help him obtain new equipment needed.  2.  Tobacco use- chew, and did recently want to try to quit with medicine prescribed to help, although has not had success with that.  Did not really try as above, and is waiting on a more appropriate time to hopefully make that effort and that was encouraged.  3.  Increased blood pressure without the diagnosis of hypertension-prior blood pressure readings have been good upon review of his medical records and per his history.  Agreed to have it checked a couple times a week on the outside as he can do that, and record the readings, and if the systolic blood pressure is  greater than 140 or the diastolic blood pressure is greater than 90 with any regularity, he is to call and follow-up and discuss next steps, possibly adding a medicine.  He was quite convinced it is likely situational, and it may be and await the above.  4.  Overweight/obesity- does have a high BMI, and probably contributes to the sleep apnea.  Monitoring his weight at present.

## 2019-04-18 ENCOUNTER — Encounter: Payer: Self-pay | Admitting: Internal Medicine

## 2019-04-29 DIAGNOSIS — G4733 Obstructive sleep apnea (adult) (pediatric): Secondary | ICD-10-CM | POA: Diagnosis not present

## 2019-05-13 ENCOUNTER — Other Ambulatory Visit: Payer: Self-pay

## 2019-05-13 ENCOUNTER — Ambulatory Visit: Payer: 59

## 2019-05-13 DIAGNOSIS — Z021 Encounter for pre-employment examination: Secondary | ICD-10-CM

## 2019-05-16 LAB — POCT URINALYSIS DIPSTICK
Bilirubin, UA: NEGATIVE
Blood, UA: POSITIVE
Glucose, UA: NEGATIVE
Ketones, UA: NEGATIVE
Leukocytes, UA: NEGATIVE
Nitrite, UA: NEGATIVE
Protein, UA: POSITIVE — AB
Spec Grav, UA: >=1.03 — AB (ref 1.010–1.025)
Urobilinogen, UA: 0.2 E.U./dL
pH, UA: 5.5 (ref 5.0–8.0)

## 2019-06-27 DIAGNOSIS — B9689 Other specified bacterial agents as the cause of diseases classified elsewhere: Secondary | ICD-10-CM | POA: Diagnosis not present

## 2019-06-27 DIAGNOSIS — Z03818 Encounter for observation for suspected exposure to other biological agents ruled out: Secondary | ICD-10-CM | POA: Diagnosis not present

## 2019-06-27 DIAGNOSIS — J019 Acute sinusitis, unspecified: Secondary | ICD-10-CM | POA: Diagnosis not present

## 2019-07-15 ENCOUNTER — Ambulatory Visit: Payer: 59 | Admitting: Occupational Medicine

## 2019-07-15 ENCOUNTER — Encounter: Payer: Self-pay | Admitting: Occupational Medicine

## 2019-07-15 VITALS — BP 140/90 | HR 97 | Temp 98.1°F | Resp 14 | Ht 69.5 in | Wt 220.0 lb

## 2019-07-15 DIAGNOSIS — M5030 Other cervical disc degeneration, unspecified cervical region: Secondary | ICD-10-CM | POA: Insufficient documentation

## 2019-07-15 DIAGNOSIS — M5412 Radiculopathy, cervical region: Secondary | ICD-10-CM | POA: Insufficient documentation

## 2019-07-15 DIAGNOSIS — M542 Cervicalgia: Secondary | ICD-10-CM | POA: Insufficient documentation

## 2019-07-15 DIAGNOSIS — M6281 Muscle weakness (generalized): Secondary | ICD-10-CM | POA: Insufficient documentation

## 2019-07-15 DIAGNOSIS — R293 Abnormal posture: Secondary | ICD-10-CM | POA: Insufficient documentation

## 2019-07-15 DIAGNOSIS — Z0000 Encounter for general adult medical examination without abnormal findings: Secondary | ICD-10-CM

## 2019-08-14 ENCOUNTER — Ambulatory Visit: Payer: 59

## 2019-08-14 ENCOUNTER — Other Ambulatory Visit: Payer: Self-pay

## 2019-08-14 DIAGNOSIS — Z01110 Encounter for hearing examination following failed hearing screening: Secondary | ICD-10-CM

## 2019-10-28 NOTE — Telephone Encounter (Signed)
I approve of referral to ENT. Will you have Cody [name] process this request?

## 2019-10-29 NOTE — Telephone Encounter (Signed)
Will do. Thank you

## 2019-10-30 NOTE — Telephone Encounter (Signed)
Referral request faxed to [facility] [phone]) 10/30/19.  They will contact Matt to schedule an appointment.  AMD

## 2019-10-31 ENCOUNTER — Telehealth: Payer: Self-pay

## 2019-10-31 NOTE — Telephone Encounter (Signed)
Susy Frizzle is scheduled to see Dr. Willeen Cass at [facility] on 11/04/2019 at 10:00am.  AMD

## 2019-11-05 ENCOUNTER — Telehealth: Payer: Self-pay | Admitting: Registered Nurse

## 2019-11-05 ENCOUNTER — Encounter: Payer: Self-pay | Admitting: Registered Nurse

## 2019-11-05 DIAGNOSIS — H903 Sensorineural hearing loss, bilateral: Secondary | ICD-10-CM | POA: Insufficient documentation

## 2019-11-05 NOTE — Telephone Encounter (Signed)
Reviewed consult results from [facility] noted bilateral sensorineural hearing loss above 2000Hz  recommends preservation of hearing/avoid loud noises, Avoid nsaids and no hearing aids recommended at this time.  Referral from Dr COB.

## 2019-11-07 ENCOUNTER — Other Ambulatory Visit: Payer: Self-pay

## 2019-11-07 DIAGNOSIS — N529 Male erectile dysfunction, unspecified: Secondary | ICD-10-CM

## 2019-11-07 MED ORDER — TADALAFIL 20 MG PO TABS: ORAL_TABLET | ORAL | 1 refills | Status: DC

## 2019-11-10 ENCOUNTER — Other Ambulatory Visit: Payer: Self-pay

## 2019-11-10 ENCOUNTER — Encounter: Payer: Self-pay | Admitting: Emergency Medicine

## 2019-11-10 ENCOUNTER — Ambulatory Visit: Payer: 59 | Admitting: Emergency Medicine

## 2019-11-10 VITALS — BP 138/92 | HR 78 | Temp 97.7°F | Resp 16 | Ht 69.0 in | Wt 225.0 lb

## 2019-11-10 DIAGNOSIS — S61210A Laceration without foreign body of right index finger without damage to nail, initial encounter: Secondary | ICD-10-CM

## 2019-11-10 MED ORDER — CEPHALEXIN 500 MG PO CAPS: 500.0000 mg | ORAL_CAPSULE | Freq: Two times a day (BID) | ORAL | 0 refills | Status: AC

## 2019-11-10 NOTE — Progress Notes (Signed)
S: Cut right index finger with a string that he was throwing in a tree on Saturday.  Tetanus is UTD. No continued bleeding.   O:  Right index finger with superficial 1 cm laceration lateral aspect of the DIP joint area.  No bleeding.  No FB. No signs of infection at this time.   A:  Laceration right index finger > 48 hours  P:  Patient will continue cleaning with mild soap and water. Cover while at work and allow to get air when at home.  Keflex was sent to his pharmacy twice a day for the next 5 days prophylactically to prevent infection.  Patient is to contact the clinic if any continued problems or concerns.

## 2019-11-10 NOTE — Progress Notes (Signed)
Cut right finger with a piece of string trying to throw up into a tree. DOI:  11/08/19 arount 11:30 am.  Has been putting Neosporin ointment on it.  AMD

## 2019-12-02 ENCOUNTER — Other Ambulatory Visit: Payer: Self-pay

## 2019-12-02 NOTE — Telephone Encounter (Signed)
Stent message to [facility] to schedule an appt to discuss with provider the need for Buproprion.  Dr. Dorris Fetch prescribed 01/2019.  AMD

## 2019-12-24 ENCOUNTER — Other Ambulatory Visit: Payer: Self-pay

## 2019-12-24 ENCOUNTER — Encounter: Payer: Self-pay | Admitting: Emergency Medicine

## 2019-12-24 ENCOUNTER — Ambulatory Visit: Payer: Self-pay | Admitting: Emergency Medicine

## 2019-12-24 VITALS — BP 136/93 | HR 81 | Temp 98.4°F | Resp 12 | Ht 69.0 in | Wt 224.0 lb

## 2019-12-24 DIAGNOSIS — R1030 Lower abdominal pain, unspecified: Secondary | ICD-10-CM

## 2019-12-24 LAB — POCT URINALYSIS DIPSTICK
Bilirubin, UA: NEGATIVE
Blood, UA: POSITIVE
Glucose, UA: NEGATIVE
Ketones, UA: NEGATIVE
Leukocytes, UA: NEGATIVE
Nitrite, UA: NEGATIVE
Protein, UA: NEGATIVE
Spec Grav, UA: 1.02 (ref 1.010–1.025)
Urobilinogen, UA: 0.2 E.U./dL
pH, UA: 6 (ref 5.0–8.0)

## 2019-12-24 MED ORDER — CIPROFLOXACIN HCL 500 MG PO TABS: 500.0000 mg | ORAL_TABLET | Freq: Two times a day (BID) | ORAL | 0 refills | Status: DC

## 2019-12-24 MED ORDER — METRONIDAZOLE 500 MG PO TABS
500.0000 mg | ORAL_TABLET | Freq: Three times a day (TID) | ORAL | 0 refills | Status: DC
Start: 2019-12-24 — End: 2020-07-30

## 2019-12-24 MED ORDER — METRONIDAZOLE 500 MG PO TABS: 500.0000 mg | ORAL_TABLET | Freq: Three times a day (TID) | ORAL | 0 refills | Status: DC

## 2019-12-24 NOTE — Progress Notes (Signed)
Pressure, stinging & pain for the past month.  Today not as bad as it's been.  5-6 years ago went to Urologist & Dx'd with BPH & was on medication.  AMD

## 2019-12-24 NOTE — Progress Notes (Signed)
  ER Provider Note       Time seen: 2:49 PM    I have reviewed the vital signs and the nursing notes.  HISTORY   Chief Complaint Groin Pain (Unsure)   HPI Cody [name] is a 49 y.o. male with a history of palpitations, OSA, vertigo who presents today for pelvic pain.  Patient has had pressure and stinging pain in the lower abdomen for the past month.  He had 1 day where he had fever over the past several days it has not been as bad.  5 to 6 years ago went to urologist and was diagnosed with BPH and was on medication for that.  He denies any other complaints.  Past Medical History:  Diagnosis Date  . Knee pain, left   . Palpitations   . Sleep apnea with use of continuous positive airway pressure (CPAP)   . Vertigo     Past Surgical History:  Procedure Laterality Date  . INGUINAL HERNIA REPAIR Bilateral   . NASAL SEPTUM SURGERY      Allergies Levofloxacin, Nsaids, and Penicillins  Review of Systems Constitutional: Negative for fever. Cardiovascular: Negative for chest pain. Respiratory: Negative for shortness of breath. Gastrointestinal: Positive for lower abdominal pain Musculoskeletal: Negative for back pain. Skin: Negative for rash. Neurological: Negative for headaches, focal weakness or numbness.  All systems negative/normal/unremarkable except as stated in the HPI  ____________________________________________   PHYSICAL EXAM:  VITAL SIGNS: Vitals:   12/24/19 1433  BP: (!) 136/93  Pulse: 81  Resp: 12  Temp: 98.4 F (36.9 C)  SpO2: 99%    Constitutional: Alert and oriented. Well appearing and in no distress. Eyes: Conjunctivae are normal. Normal extraocular movements. Cardiovascular: Normal rate, regular rhythm. No murmurs, rubs, or gallops. Respiratory: Normal respiratory effort without tachypnea nor retractions. Breath sounds are clear and equal bilaterally. No wheezes/rales/rhonchi. Gastrointestinal: Mild left lower quadrant tenderness, no  rebound or guarding.  Normal bowel sounds. Genitourinary: No hernias are appreciated, testicles are nontender Musculoskeletal: Nontender with normal range of motion in extremities. No lower extremity tenderness nor edema. Neurologic:  Normal speech and language. No gross focal neurologic deficits are appreciated.  Skin:  Skin is warm, dry and intact. No rash noted. Psychiatric: Speech and behavior are normal.  ____________________________________________   LABS (pertinent positives/negatives)  No results found for this or any previous visit (from the past 2160 hour(s)).  DIFFERENTIAL DIAGNOSIS  UTI, diverticulitis, muscle strain, scar tissue, constipation, AAA unlikely  ASSESSMENT AND PLAN  Lower abdominal pain   Plan: The patient had presented for lower abdominal pain.  I am starting him on Cipro and Flagyl for what is presumed to be mild diverticulitis.  He will follow-up with [place] early next week for reevaluation.  Should the pain persist he will need CT imaging of the abdomen.  Daryel November MD    Note: This note was generated in part or whole with voice recognition software. Voice recognition is usually quite accurate but there are transcription errors that can and very often do occur. I apologize for any typographical errors that were not detected and corrected.

## 2019-12-29 ENCOUNTER — Telehealth: Payer: Self-pay

## 2019-12-29 NOTE — Telephone Encounter (Signed)
Seen in the clinic 12/24/19 - Lower Abdominal Pain Prescribed Cipro & Flagyl.  States hasn't completed - should finish on Tuesday or Wednesday. When 1st started ABX, thought he was getting better.  But, states he really hasn't seen much improvement.  Today the pain is bothering him more & he still hasn't completed the ABX. States he was supposed to follow-up at the 1st of the week through MyChart, but you are not listed as a choice for him in MyChart. States he wants to move along to the next step of the plan of care.  AMD

## 2019-12-30 NOTE — Addendum Note (Signed)
Addended by: Gardner Candle on: 12/30/2019 10:23 AM   Modules accepted: Orders

## 2020-01-02 ENCOUNTER — Ambulatory Visit
Admission: RE | Admit: 2020-01-02 | Discharge: 2020-01-02 | Disposition: A | Discharge status: Home / Self Care | Payer: 59 | Source: Ambulatory Visit | Attending: Emergency Medicine | Admitting: Emergency Medicine

## 2020-01-02 ENCOUNTER — Other Ambulatory Visit: Payer: Self-pay

## 2020-01-02 DIAGNOSIS — R1030 Lower abdominal pain, unspecified: Secondary | ICD-10-CM | POA: Insufficient documentation

## 2020-01-02 MED ORDER — IOHEXOL 300 MG/ML  SOLN
100.0000 mL | Freq: Once | INTRAMUSCULAR | Status: AC | PRN
  Administered 2020-01-02: 100 mL via INTRAVENOUS

## 2020-01-22 ENCOUNTER — Ambulatory Visit: Payer: 59

## 2020-01-29 ENCOUNTER — Encounter: Payer: Self-pay | Admitting: Emergency Medicine

## 2020-01-29 ENCOUNTER — Ambulatory Visit: Payer: Self-pay | Admitting: Emergency Medicine

## 2020-01-29 ENCOUNTER — Other Ambulatory Visit: Payer: Self-pay

## 2020-01-29 VITALS — BP 126/94 | HR 74 | Temp 98.0°F | Ht 69.0 in | Wt 214.0 lb

## 2020-01-29 DIAGNOSIS — F418 Other specified anxiety disorders: Secondary | ICD-10-CM

## 2020-01-29 MED ORDER — PAROXETINE HCL 10 MG PO TABS: 10.0000 mg | ORAL_TABLET | Freq: Every day | ORAL | 1 refills | Status: DC

## 2020-01-29 NOTE — Progress Notes (Signed)
Patient presents to the office to discuss problems with anxiety and mild depression.  He does not give details of an incident that has happened which has caused him a lot of anxiety between him and the city.  Currently he is talking to a counselor to help redirect his anger.  He states that he is not his usual happy self.  He states that even when he is mowing his yard he is thinking of the incident which causes him to get angry and worked up.  Patient denies any suicidal or homicidal ideation.  He continues to work.  He has had difficulty sleeping due to thinking about this.  He also has had some fatigue due to his inability to sleep.  He recognizes that he is depressed.  He has already had several sessions with a counselor and continues to see her regularly.  Anxiety/depression  Discussed an antidepressant to be taken in the mornings.  He will be started at a low dose and can be titrated up if needed.  He is to follow-up in 1 month for reevaluation of this medication and also if adjustments are needed.  I prescription for  Paxil 10 mg sent to his pharmacy #30 with 1 refill.  He is encouraged to call clinic if he needs to be seen prior to the 30 days or if he is unable to tolerate medication.

## 2020-01-29 NOTE — Progress Notes (Signed)
Pt mentioned he wants to talk about depression and anxiety. CL/RMA

## 2020-02-24 ENCOUNTER — Other Ambulatory Visit: Payer: Self-pay

## 2020-02-24 DIAGNOSIS — F418 Other specified anxiety disorders: Secondary | ICD-10-CM

## 2020-02-24 MED ORDER — PAROXETINE HCL 10 MG PO TABS: 10.0000 mg | ORAL_TABLET | Freq: Every day | ORAL | 3 refills | Status: DC

## 2020-03-03 ENCOUNTER — Encounter: Payer: Self-pay | Admitting: Emergency Medicine

## 2020-03-04 ENCOUNTER — Encounter: Payer: Self-pay | Admitting: Emergency Medicine

## 2020-04-28 ENCOUNTER — Other Ambulatory Visit: Payer: Self-pay

## 2020-04-28 ENCOUNTER — Ambulatory Visit: Payer: Self-pay

## 2020-04-28 DIAGNOSIS — Z0000 Encounter for general adult medical examination without abnormal findings: Secondary | ICD-10-CM

## 2020-04-28 LAB — POCT URINALYSIS DIPSTICK
Bilirubin, UA: NEGATIVE
Blood, UA: NEGATIVE
Glucose, UA: NEGATIVE
Ketones, UA: NEGATIVE
Leukocytes, UA: NEGATIVE
Nitrite, UA: NEGATIVE
Protein, UA: NEGATIVE
Spec Grav, UA: >=1.03 — AB (ref 1.010–1.025)
Urobilinogen, UA: 0.2 E.U./dL
pH, UA: 5.5 (ref 5.0–8.0)

## 2020-04-28 NOTE — Progress Notes (Signed)
Scheduled to complete physical 05/06/20 with Bridget Hartshorn, PA-C.  AMD

## 2020-04-30 LAB — CMP12+LP+TP+TSH+6AC+PSA+CBC…
ALT: 29 IU/L (ref 0–44)
AST: 19 IU/L (ref 0–40)
Albumin/Globulin Ratio: 1.6 (ref 1.2–2.2)
Albumin: 4.4 g/dL (ref 4.0–5.0)
Alkaline Phosphatase: 91 IU/L (ref 44–121)
BUN/Creatinine Ratio: 11 (ref 9–20)
BUN: 15 mg/dL (ref 6–24)
Basophils Absolute: 0 10*3/uL (ref 0.0–0.2)
Basos: 1 %
Bilirubin Total: 0.6 mg/dL (ref 0.0–1.2)
Calcium: 9.2 mg/dL (ref 8.7–10.2)
Chloride: 103 mmol/L (ref 96–106)
Chol/HDL Ratio: 4.5 ratio (ref 0.0–5.0)
Cholesterol, Total: 191 mg/dL (ref 100–199)
Creatinine, Ser: 1.35 mg/dL — ABNORMAL HIGH (ref 0.76–1.27)
EOS (ABSOLUTE): 0.1 10*3/uL (ref 0.0–0.4)
Eos: 2 %
Estimated CHD Risk: 0.9 times avg. (ref 0.0–1.0)
Free Thyroxine Index: 1.9 (ref 1.2–4.9)
GFR calc Af Amer: 71 mL/min/{1.73_m2} (ref >59)
GFR calc non Af Amer: 61 mL/min/{1.73_m2} (ref >59)
GGT: 26 IU/L (ref 0–65)
Globulin, Total: 2.8 g/dL (ref 1.5–4.5)
Glucose: 127 mg/dL — ABNORMAL HIGH (ref 65–99)
HDL: 42 mg/dL (ref >39)
Hematocrit: 46.4 % (ref 37.5–51.0)
Hemoglobin: 16.2 g/dL (ref 13.0–17.7)
Immature Grans (Abs): 0 10*3/uL (ref 0.0–0.1)
Immature Granulocytes: 0 %
Iron: 143 ug/dL (ref 38–169)
LDH: 145 IU/L (ref 121–224)
LDL Chol Calc (NIH): 122 mg/dL — ABNORMAL HIGH (ref 0–99)
Lymphocytes Absolute: 2.1 10*3/uL (ref 0.7–3.1)
Lymphs: 31 %
MCH: 32.6 pg (ref 26.6–33.0)
MCHC: 34.9 g/dL (ref 31.5–35.7)
MCV: 93 fL (ref 79–97)
Monocytes Absolute: 0.5 10*3/uL (ref 0.1–0.9)
Monocytes: 8 %
Neutrophils Absolute: 3.9 10*3/uL (ref 1.4–7.0)
Neutrophils: 58 %
Phosphorus: 3 mg/dL (ref 2.8–4.1)
Platelets: 197 10*3/uL (ref 150–450)
Potassium: 3.9 mmol/L (ref 3.5–5.2)
Prostate Specific Ag, Serum: 2.3 ng/mL (ref 0.0–4.0)
RBC: 4.97 x10E6/uL (ref 4.14–5.80)
RDW: 13.4 % (ref 11.6–15.4)
Sodium: 140 mmol/L (ref 134–144)
T3 Uptake Ratio: 28 % (ref 24–39)
T4, Total: 6.7 ug/dL (ref 4.5–12.0)
TSH: 2.69 u[IU]/mL (ref 0.450–4.500)
Total Protein: 7.2 g/dL (ref 6.0–8.5)
Triglycerides: 149 mg/dL (ref 0–149)
Uric Acid: 8.1 mg/dL (ref 3.8–8.4)
VLDL Cholesterol Cal: 27 mg/dL (ref 5–40)
WBC: 6.7 10*3/uL (ref 3.4–10.8)

## 2020-04-30 LAB — QUANTIFERON-TB GOLD PLUS
QuantiFERON Mitogen Value: >10 IU/mL
QuantiFERON Nil Value: 0 IU/mL
QuantiFERON TB1 Ag Value: 0.03 IU/mL
QuantiFERON TB2 Ag Value: 0.04 IU/mL
QuantiFERON-TB Gold Plus: NEGATIVE

## 2020-05-06 ENCOUNTER — Ambulatory Visit: Payer: Self-pay | Admitting: Emergency Medicine

## 2020-05-06 ENCOUNTER — Encounter: Payer: Self-pay | Admitting: Emergency Medicine

## 2020-05-06 ENCOUNTER — Other Ambulatory Visit: Payer: Self-pay

## 2020-05-06 VITALS — BP 133/88 | HR 98 | Temp 98.4°F | Resp 16 | Ht 69.0 in | Wt 223.0 lb

## 2020-05-06 DIAGNOSIS — N529 Male erectile dysfunction, unspecified: Secondary | ICD-10-CM

## 2020-05-06 DIAGNOSIS — Z0000 Encounter for general adult medical examination without abnormal findings: Secondary | ICD-10-CM

## 2020-05-06 MED ORDER — TADALAFIL 20 MG PO TABS: ORAL_TABLET | ORAL | 1 refills | Status: DC

## 2020-05-06 NOTE — Progress Notes (Signed)
Pt presents today to complete physical with [employer]. Pt would like a rx refill on tadalafil as well./CL,RMA

## 2020-05-06 NOTE — Progress Notes (Signed)
I have reviewed the triage vital signs and the nursing notes.   HISTORY  Chief Complaint Annual Exam HPI Cody [name] is a 49 y.o. male is here for annual physical.  He also wants a refill of his Cialis no other complaints or concerns.        Past Medical History:  Diagnosis Date  . Knee pain, left   . Palpitations   . Sleep apnea with use of continuous positive airway pressure (CPAP)   . Vertigo     Patient Active Problem List   Diagnosis Date Noted  . Sensorineural hearing loss (SNHL) of both ears 11/05/2019  . Abnormal posture 07/15/2019  . Brachial radiculitis 07/15/2019  . DDD (degenerative disc disease), cervical 07/15/2019  . Muscle weakness 07/15/2019  . Neck pain 07/15/2019  . Tobacco dependence 04/01/2019  . Elevated BP without diagnosis of hypertension 04/01/2019  . Class 1 obesity due to excess calories without serious comorbidity with body mass index (BMI) of 31.0 to 31.9 in adult 04/01/2019  . Sprain of ankle 04/02/2018  . Sleep apnea 02/27/2014  . GERD (gastroesophageal reflux disease) 02/27/2014  . Hyperlipidemia 02/27/2014    Past Surgical History:  Procedure Laterality Date  . INGUINAL HERNIA REPAIR Bilateral   . NASAL SEPTUM SURGERY      Prior to Admission medications   Medication Sig Start Date End Date Taking? Authorizing Provider  PARoxetine (PAXIL) 10 MG tablet Take 1 tablet (10 mg total) by mouth daily. 02/24/20  Yes Emily Filbert, MD  predniSONE (DELTASONE) 20 MG tablet prednisone 20 mg tablet   Yes [provider]  tadalafil (CIALIS) 20 MG tablet 1 tab po daily prn ED, max is 1 tab per 24 hours 11/07/19  Yes Joni Reining, PA-C  buPROPion (ZYBAN) 150 MG 12 hr tablet bupropion HCl 150 mg tablet,12 hr sustained-release(smoking deterrent)  TAKE 1 TABLET (150 MG TOTAL) BY MOUTH 2 (TWO) TIMES DAILY. TAKE ONCE DAILY FOR FIRST THREE DAYS.    [provider]  ciprofloxacin (CIPRO) 500 MG tablet Take 1 tablet (500 mg  total) by mouth 2 (two) times daily. Patient not taking: Reported on 01/29/2020 12/24/19   Emily Filbert, MD  metroNIDAZOLE (FLAGYL) 500 MG tablet Take 1 tablet (500 mg total) by mouth 3 (three) times daily. Patient not taking: Reported on 01/29/2020 12/24/19   Emily Filbert, MD  metroNIDAZOLE (FLAGYL) 500 MG tablet Take 1 tablet (500 mg total) by mouth 3 (three) times daily. Patient not taking: Reported on 01/29/2020 12/24/19   Emily Filbert, MD    Allergies Amoxicillin-pot clavulanate, Levofloxacin, Nsaids, and Penicillins  Family History  Problem Relation Age of Onset  . Heart disease Father   . COPD Father   . Lung cancer Father   . Bladder Cancer Brother   . Diabetes Maternal Grandmother     Social History Social History   Tobacco Use  . Smoking status: Former Games developer  . Smokeless tobacco: Current User    Types: Chew  Substance Use Topics  . Alcohol use: Yes    Comment: socially  . Drug use: Not on file    Review of Systems Constitutional: No fever/chills Eyes: No visual changes. Cardiovascular: Denies chest pain. Respiratory: Denies shortness of breath. Gastrointestinal: No abdominal pain.  No nausea, no vomiting.  Musculoskeletal: Negative for musculoskeletal complaints. Skin: Negative for rash. Neurological: Negative for headaches, focal weakness or numbness. ____________________________________________   PHYSICAL EXAM:  Constitutional: Alert and oriented. Well appearing and in  no acute distress. Eyes: Conjunctivae are normal. PERRL. EOMI. Head: Atraumatic. Neck: No stridor.  Nontender cervical spine to palpation posteriorly. Hematological/Lymphatic/Immunilogical: No cervical lymphadenopathy. Cardiovascular: Normal rate, regular rhythm. Grossly normal heart sounds.  Good peripheral circulation. Respiratory: Normal respiratory effort.  No retractions. Lungs CTAB. Gastrointestinal: Soft and nontender. No distention.  Bowel sounds normoactive x4  quadrants. Musculoskeletal: No tenderness on palpation of thoracic or lumbar spine.  Joints without pain or difficulty with range of motion.  Good muscle strength bilaterally. Neurologic:  Normal speech and language.  Reflexes 2+ bilaterally.  No gross focal neurologic deficits are appreciated. No gait instability. Skin:  Skin is warm, dry and intact. No rash noted. Psychiatric: Mood and affect are normal. Speech and behavior are normal.  ____________________________________________   LABS (all labs ordered are listed, but only abnormal results are displayed)  Discussed with patient.   ____________________________________________  EKG  Sinus rhythm with a ventricular rate of 81.  PR interval 172. ____________________________________________   FINAL CLINICAL IMPRESSION(S)  Normal annual exam with no acute findings. Prescription refill.    ED Discharge Orders    None      Note:  This document was prepared using Dragon voice recognition software and may include unintentional dictation errors.

## 2020-07-30 ENCOUNTER — Ambulatory Visit: Payer: Self-pay | Admitting: Physician Assistant

## 2020-07-30 ENCOUNTER — Other Ambulatory Visit: Payer: Self-pay

## 2020-07-30 ENCOUNTER — Encounter: Payer: Self-pay | Admitting: Physician Assistant

## 2020-07-30 VITALS — BP 139/86 | HR 87 | Temp 98.8°F | Resp 14 | Ht 69.0 in | Wt 220.0 lb

## 2020-07-30 DIAGNOSIS — M25521 Pain in right elbow: Secondary | ICD-10-CM

## 2020-07-30 MED ORDER — DICLOFENAC SODIUM 1 % EX GEL: 2.0000 g | Freq: Four times a day (QID) | CUTANEOUS | 0 refills | Status: DC

## 2020-07-30 NOTE — Progress Notes (Signed)
   Subjective: Right shoulder and elbow pain    Patient ID: Cody [name], male    DOB: 08/25/1970, 50 y.o.   MRN: [id number]  HPI Patient complaint of 2 months of right elbow pain.  Also states having 1 month of right shoulder pain patient elbow pain is posterior lateral.  Patient states shoulder pain is at the [facility] joint.  No specific provocative incident for either complaint.  Patient has been to chiropractor care for 1 month of the right elbow pain without any noticeable relief.  Patient denies loss of sensation.  Patient has  Full range of movement although it does increase  pain with fixed flexion and extension.  Rates the pain as a 6/10.  Described pain as "achy".  Review of Systems    Anxiety and erectile dysfunction. Objective:   Physical Exam  No obvious deformity to the right upper extremity.  No edema or erythema.  Patient has full and equal range of motion.  Patient decreased strength against resistance.  Patient has mild to moderate guarding palpation GH joint and the olecranon process.      Assessment & Plan: Arthralgia right upper extremity.  Patient given a prescription for Voltaren cream pending evaluation with imaging.  Patient return back in 4 days for reevaluation.

## 2020-07-30 NOTE — Addendum Note (Signed)
Addended by: Gardner Candle on: 07/30/2020 02:06 PM   Modules accepted: Orders

## 2020-08-02 ENCOUNTER — Ambulatory Visit
Admission: RE | Admit: 2020-08-02 | Discharge: 2020-08-02 | Disposition: A | Discharge status: Home / Self Care | Payer: 59 | Attending: Physician Assistant | Admitting: Physician Assistant

## 2020-08-02 ENCOUNTER — Other Ambulatory Visit: Payer: Self-pay

## 2020-08-02 ENCOUNTER — Ambulatory Visit
Admission: RE | Admit: 2020-08-02 | Discharge: 2020-08-02 | Disposition: A | Discharge status: Home / Self Care | Payer: 59 | Source: Ambulatory Visit | Attending: Physician Assistant | Admitting: Physician Assistant

## 2020-08-02 DIAGNOSIS — M25521 Pain in right elbow: Secondary | ICD-10-CM

## 2020-08-03 ENCOUNTER — Ambulatory Visit: Payer: Self-pay | Admitting: Physician Assistant

## 2020-08-03 ENCOUNTER — Encounter: Payer: Self-pay | Admitting: Physician Assistant

## 2020-08-03 VITALS — BP 137/84 | HR 77 | Temp 98.8°F | Resp 14 | Ht 69.0 in | Wt 220.0 lb

## 2020-08-03 DIAGNOSIS — M778 Other enthesopathies, not elsewhere classified: Secondary | ICD-10-CM

## 2020-08-03 DIAGNOSIS — M7711 Lateral epicondylitis, right elbow: Secondary | ICD-10-CM

## 2020-08-03 NOTE — Progress Notes (Signed)
   Subjective: Right upper extremity pain    Patient ID: Cody [name], male    DOB: [birth year], 50 y.o.   MRN: [id number]  HPI Patient here today for follow-up of right upper extremity pain for 2 months.  Patient was sent from his last visit to get imaging of the right shoulder and elbow.  Patient here today to discuss x-ray results.  Patient states since last visit has remarkable relief using Voltaren ointment to the elbow.   Review of Systems    Anxiety and erectile dysfunction. Objective:   Physical Exam BP 137/84, pulse 77, respiration 14, temperature 98.8. No obvious deformity to the right upper extremity.  Neurovascular intact.  Patient has full neck range of motion at this time.  Discussed x-ray findings with shoulder revealing spurring at the lesser tuberosity.  Discussed elbow findings with findings consistent with epicondylitis.       Assessment & Plan: Arthralgia right upper extremity  Discussed degenerative changes of the right shoulder with patient.  Discussed epicondylitis with patient.  Patient elected to continue conservative treatment consisting of Voltaren ointment.  Will consult to orthopedics if complaining the pain increases.

## 2020-09-06 ENCOUNTER — Ambulatory Visit: Payer: Self-pay | Admitting: Adult Medicine

## 2020-09-06 ENCOUNTER — Other Ambulatory Visit: Payer: Self-pay

## 2020-09-06 ENCOUNTER — Other Ambulatory Visit: Payer: Self-pay | Admitting: Adult Medicine

## 2020-09-06 VITALS — BP 136/90 | HR 88 | Temp 98.8°F | Resp 14 | Ht 69.0 in | Wt 220.0 lb

## 2020-09-06 DIAGNOSIS — H9203 Otalgia, bilateral: Secondary | ICD-10-CM

## 2020-09-06 MED ORDER — AFRIN NASAL SPRAY 0.05 % NA SOLN: 1.0000 | Freq: Two times a day (BID) | NASAL | 0 refills | Status: DC

## 2020-09-06 NOTE — Progress Notes (Signed)
Pt presents today stating having fluid in ears over the weeknd. Pt stated he had a flight 09/03/20 and ever since then he's had an annoying/agrovating pressure. CL,RMA

## 2020-09-06 NOTE — Progress Notes (Signed)
   Subjective:    Patient ID: Cody [name], male    DOB: 1970-09-23, 50 y.o.   MRN: [id number]  HPI  Presents with 'stuffy ears' with postnasal mucus Lt>Rt After plane travel this weekend develop fullness both ear that has remained Denies headache tinnitis, pain or change in hearing or balance    Review of Systems   Otherwise no current issue reported    Objective:   Physical Exam is unremarkable except @focus  exam of  Heent  Atraumatic, eyes perrla, throat not injected   Mild enlarged lymph node rt submandible only   Rt Tm small spot blood dull light reflex, nl  Light reflex Rt Tm; both without retraction        Assessment & Plan:  Add  vicks 12 hr nasal spray is not in formulary        Add Afrin Decongestant bd  Re check Tm in 72hrs or if worse as needed

## 2020-09-09 ENCOUNTER — Telehealth: Payer: Self-pay | Admitting: Family Medicine

## 2020-09-09 DIAGNOSIS — M778 Other enthesopathies, not elsewhere classified: Secondary | ICD-10-CM

## 2020-09-09 NOTE — Telephone Encounter (Signed)
Pt. Called and said his shoulder is not getting any better and  The provider he saw in 1/22 mentioned to him about going to Ortho if it did not get better. He is requesting that referral to Ortho and would like a call back when it is done.

## 2020-09-10 NOTE — Addendum Note (Signed)
Addended by: Gardner Candle on: 09/10/2020 10:55 AM   Modules accepted: Orders

## 2020-09-10 NOTE — Progress Notes (Signed)
Cody [name] called [facility] 09/09/20 & spoke with Ulla Gallo, RN (Interim Nurse).  Rebecca's message to me:  "Pt. Called and said his shoulder is not getting any better and  The provider he saw in 1/22 mentioned to him about going to Ortho if it did not get better. He is requesting that referral to Ortho and would like a call back when it is done."  Contacted Cody [name] to see which Orthopedic practice he prefers & he requested a referral to [facility].  AMD

## 2020-09-10 NOTE — Addendum Note (Signed)
Addended by: Gardner Candle on: 09/10/2020 11:11 AM   Modules accepted: Orders

## 2020-12-16 ENCOUNTER — Other Ambulatory Visit: Payer: Self-pay

## 2020-12-16 ENCOUNTER — Encounter: Payer: Self-pay | Admitting: Physician Assistant

## 2020-12-16 ENCOUNTER — Ambulatory Visit: Payer: Self-pay | Admitting: Physician Assistant

## 2020-12-16 VITALS — HR 69 | Temp 97.8°F | Resp 14 | Ht 69.0 in | Wt 185.0 lb

## 2020-12-16 DIAGNOSIS — H01002 Unspecified blepharitis right lower eyelid: Secondary | ICD-10-CM

## 2020-12-16 DIAGNOSIS — L089 Local infection of the skin and subcutaneous tissue, unspecified: Secondary | ICD-10-CM

## 2020-12-16 MED ORDER — SELENIUM SULFIDE 1 % EX LOTN: TOPICAL_LOTION | Freq: Every day | CUTANEOUS | Status: DC

## 2020-12-16 MED ORDER — SULFAMETHOXAZOLE-TRIMETHOPRIM 800-160 MG PO TABS: 1.0000 | ORAL_TABLET | Freq: Two times a day (BID) | ORAL | 0 refills | Status: DC

## 2020-12-16 NOTE — Progress Notes (Signed)
   Subjective: Left index finger injury.     Patient ID: Cody [name], male    DOB: 1971-05-26, 50 y.o.   MRN: [id number]  HPI Patient presents with left index finger injury secondary to puncture wound by drill bit 1/2 weeks ago.  Patient stated that he was doing well till he noticed yesterday with greenish discharge.  Patient also complain of dry flaky rash to the right upper eyelash and eyebrow.  Denies vision disturbance associated with this complaint.   Review of Systems Hyperlipidemia, hypertension, sleep apnea.    Objective:   Physical Exam No acute distress.  Dry flaky rash to the upper eyelid and eyebrow.  Puncture wound to the medial aspect of the second digit left hand with edema and erythema.  Finger is neurovascular intact with free and equal range of motion.      Assessment & Plan:Blepharitis and cellulitis of left index finger  Patient advised Selsun shampoo to affected area as directed 1-2 times a day.  Patient given prescription for Bactrim DS and advised Epson salt soaks for the index finger.  Follow-up with no resolution.

## 2021-03-21 NOTE — Progress Notes (Signed)
Pt states annual physical scheduled 03/29/21.  Reviewed CDC recommendations for importance of HIV/ Hep C screening once in lifetime. Patient has declined HIV / Hep C screenings today and will let us know if they should change their mind in the future.

## 2021-03-22 ENCOUNTER — Other Ambulatory Visit: Payer: Self-pay

## 2021-03-22 ENCOUNTER — Ambulatory Visit: Payer: Self-pay

## 2021-03-22 DIAGNOSIS — Z0000 Encounter for general adult medical examination without abnormal findings: Secondary | ICD-10-CM

## 2021-03-22 LAB — POCT URINALYSIS DIPSTICK
Bilirubin, UA: NEGATIVE
Glucose, UA: NEGATIVE
Ketones, UA: NEGATIVE
Leukocytes, UA: NEGATIVE
Nitrite, UA: NEGATIVE
Protein, UA: POSITIVE — AB
Spec Grav, UA: >=1.03 — AB (ref 1.010–1.025)
Urobilinogen, UA: 0.2 E.U./dL
pH, UA: 5.5 (ref 5.0–8.0)

## 2021-03-23 LAB — CMP12+LP+TP+TSH+6AC+PSA+CBC…
ALT: 15 IU/L (ref 0–44)
AST: 14 IU/L (ref 0–40)
Albumin/Globulin Ratio: 2.1 (ref 1.2–2.2)
Albumin: 4.4 g/dL (ref 4.0–5.0)
Alkaline Phosphatase: 83 IU/L (ref 44–121)
BUN/Creatinine Ratio: 14 (ref 9–20)
BUN: 16 mg/dL (ref 6–24)
Basophils Absolute: 0 10*3/uL (ref 0.0–0.2)
Basos: 1 %
Bilirubin Total: 0.5 mg/dL (ref 0.0–1.2)
Calcium: 9.3 mg/dL (ref 8.7–10.2)
Chloride: 102 mmol/L (ref 96–106)
Chol/HDL Ratio: 3.4 ratio (ref 0.0–5.0)
Cholesterol, Total: 178 mg/dL (ref 100–199)
Creatinine, Ser: 1.13 mg/dL (ref 0.76–1.27)
EOS (ABSOLUTE): 0.1 10*3/uL (ref 0.0–0.4)
Eos: 2 %
Estimated CHD Risk: 0.5 times avg. (ref 0.0–1.0)
Free Thyroxine Index: 1.7 (ref 1.2–4.9)
GGT: 18 IU/L (ref 0–65)
Globulin, Total: 2.1 g/dL (ref 1.5–4.5)
Glucose: 133 mg/dL — ABNORMAL HIGH (ref 65–99)
HDL: 52 mg/dL (ref >39)
Hematocrit: 44.2 % (ref 37.5–51.0)
Hemoglobin: 14.8 g/dL (ref 13.0–17.7)
Immature Grans (Abs): 0 10*3/uL (ref 0.0–0.1)
Immature Granulocytes: 0 %
Iron: 165 ug/dL (ref 38–169)
LDH: 136 IU/L (ref 121–224)
LDL Chol Calc (NIH): 103 mg/dL — ABNORMAL HIGH (ref 0–99)
Lymphocytes Absolute: 1.8 10*3/uL (ref 0.7–3.1)
Lymphs: 32 %
MCH: 31.4 pg (ref 26.6–33.0)
MCHC: 33.5 g/dL (ref 31.5–35.7)
MCV: 94 fL (ref 79–97)
Monocytes Absolute: 0.5 10*3/uL (ref 0.1–0.9)
Monocytes: 9 %
Neutrophils Absolute: 3.3 10*3/uL (ref 1.4–7.0)
Neutrophils: 56 %
Phosphorus: 2.5 mg/dL — ABNORMAL LOW (ref 2.8–4.1)
Platelets: 167 10*3/uL (ref 150–450)
Potassium: 4 mmol/L (ref 3.5–5.2)
Prostate Specific Ag, Serum: 1.5 ng/mL (ref 0.0–4.0)
RBC: 4.72 x10E6/uL (ref 4.14–5.80)
RDW: 13.1 % (ref 11.6–15.4)
Sodium: 140 mmol/L (ref 134–144)
T3 Uptake Ratio: 28 % (ref 24–39)
T4, Total: 6.2 ug/dL (ref 4.5–12.0)
TSH: 1.77 u[IU]/mL (ref 0.450–4.500)
Total Protein: 6.5 g/dL (ref 6.0–8.5)
Triglycerides: 129 mg/dL (ref 0–149)
Uric Acid: 6.7 mg/dL (ref 3.8–8.4)
VLDL Cholesterol Cal: 23 mg/dL (ref 5–40)
WBC: 5.8 10*3/uL (ref 3.4–10.8)
eGFR: 80 mL/min/{1.73_m2} (ref >59)

## 2021-03-24 LAB — HGB A1C W/O EAG: Hgb A1c MFr Bld: 5.6 % (ref 4.8–5.6)

## 2021-03-24 LAB — SPECIMEN STATUS REPORT

## 2021-03-29 ENCOUNTER — Encounter: Payer: Self-pay | Admitting: Physician Assistant

## 2021-03-29 ENCOUNTER — Other Ambulatory Visit: Payer: Self-pay

## 2021-03-29 ENCOUNTER — Ambulatory Visit: Payer: Self-pay | Admitting: Physician Assistant

## 2021-03-29 VITALS — BP 122/77 | HR 84 | Temp 98.0°F | Resp 14 | Ht 69.0 in | Wt 193.0 lb

## 2021-03-29 DIAGNOSIS — Z0000 Encounter for general adult medical examination without abnormal findings: Secondary | ICD-10-CM

## 2021-03-29 NOTE — Progress Notes (Signed)
Pt denies any issues or concerns at this time/CL,RMA ?

## 2021-03-29 NOTE — Progress Notes (Signed)
   Subjective: Annual firefighter exam    Patient ID: Cody [name], male    DOB: 07-31-1970, 50 y.o.   MRN: [id number]  HPI Patient presents for annual firefighter exam.  Concern for 3 weeks of intermitting right posterior hip pain with prolonged standing.  Denies radicular component to pain.  Denies bladder or bowel dysfunction.  No other provocative incident for complaint. Review of Systems Follow-up yearly.      Objective:   Physical Exam No acute distress.  Temperature 98, pulse 84, respiration 14, and BP is 122/77.  Patient 90% O2 sat on room air.  Patient with 193 pounds and BMI is 28.5. HEENT is unremarkable.  Neck is supple without adenopathy or bruits.  Lungs are clear to auscultation.  Heart is regular rate and rhythm.  No acute findings on EKG. Abdomen is negative HSM, normoactive bowel sounds, soft, nontender to palpation. No obvious deformity to the upper or lower extremities.  Patient has full equal range of motion upper and lower extremities.  No leg length deficiency.  Patient has moderate guarding with palpation of the posterior iliac crest. No obvious cervical or lumbar spine deformity.  Patient has full and equal range of motion of the cervical lumbar spine. Cranial nerves II through XII grossly intact.       Assessment & Plan: Well exam.   Discussed elevated glucose readings with a normal hemoglobin A1c.  Advised patient we will monitor hemoglobin A1c in 3 to 6 months.  Patient advised conservative supportive care for right hip pain.  Follow-up if pain increases.

## 2021-04-05 ENCOUNTER — Other Ambulatory Visit: Payer: Self-pay

## 2021-04-05 ENCOUNTER — Ambulatory Visit: Payer: Self-pay | Admitting: Physician Assistant

## 2021-04-05 ENCOUNTER — Encounter: Payer: Self-pay | Admitting: Physician Assistant

## 2021-04-05 VITALS — BP 132/92 | HR 81 | Temp 98.0°F | Resp 14 | Ht 69.0 in | Wt 195.0 lb

## 2021-04-05 DIAGNOSIS — S46912A Strain of unspecified muscle, fascia and tendon at shoulder and upper arm level, left arm, initial encounter: Secondary | ICD-10-CM

## 2021-04-05 MED ORDER — ORPHENADRINE CITRATE ER 100 MG PO TB12: 100.0000 mg | ORAL_TABLET | Freq: Two times a day (BID) | ORAL | 0 refills | Status: DC

## 2021-04-05 NOTE — Progress Notes (Signed)
   Subjective: Neck and left scapular pain    Patient ID: Cody [name], male    DOB: 1971-07-11, 50 y.o.   MRN: [id number]  HPI Patient complains of lower neck and left scapular pain for 1 week.Treatment for eustachian tube dysfunction he went to a chiropractor and masseuse with only mild relief.  No provocative incident for complaint.  Patient is right-hand dominant.  Past medical history of impingement syndrome of the right shoulder. Review of Systems Negative septal complaint    Objective:   Physical Exam  No acute distress.  Temperature 98, pulse 81, respiration 14, BP is 132/92, and patient 100% O2 sat on room air.  Patient with 195 pounds and BMI is 28.80.  No obvious cervical spine deformity.  Patient has full and equal range of motion of the cervical spine.  Patient has full and equal range of motion of the upper extremities.  Moderate guarding palpation of the inferior scapular area muscle area.      Assessment & Plan: Left scapular muscle strain.  P Lidoderm patch was applied to affected area prior to departure.  Patient advised to wear the patch for 12 hours.  Patient given a prescription for Norflex to take twice a day and follow-up in 1 week.

## 2021-04-19 ENCOUNTER — Encounter: Payer: Self-pay | Admitting: Physician Assistant

## 2021-04-19 ENCOUNTER — Telehealth: Payer: Self-pay | Admitting: Physician Assistant

## 2021-04-19 ENCOUNTER — Other Ambulatory Visit: Payer: Self-pay | Admitting: Physician Assistant

## 2021-04-19 DIAGNOSIS — Z716 Tobacco abuse counseling: Secondary | ICD-10-CM

## 2021-04-19 MED ORDER — BUPROPION HCL ER (SR) 150 MG PO TB12: 150.0000 mg | ORAL_TABLET | Freq: Two times a day (BID) | ORAL | 3 refills | Status: DC

## 2021-04-19 NOTE — Progress Notes (Signed)
   Subjective: Smoking cessation    Patient ID: Cody [name], male    DOB: Jun 05, 1971, 50 y.o.   MRN: [id number]  HPI Patient request to restart Wellbutrin for smoking cessation.  Patient has used this medication in the past approximately 5 to 7 years ago with success.  Patient is using oral tobacco products at this time and desires to discontinue.   Review of Systems GERD, hyperlipidemia, sleep apnea.    Objective:   Physical Exam Deferred as this is a virtual visit.       Assessment & Plan: Smoking cessation.   Patient given counseling smoke cessation and will start Wellbutrin 150 mg daily for 3 days then increase to 150 mg twice daily for 12 weeks.

## 2021-04-20 NOTE — Telephone Encounter (Signed)
Discuss smoking cessation.

## 2021-05-11 ENCOUNTER — Other Ambulatory Visit: Payer: Self-pay | Admitting: Physician Assistant

## 2021-06-20 ENCOUNTER — Ambulatory Visit: Payer: Self-pay | Admitting: Physician Assistant

## 2021-06-20 ENCOUNTER — Telehealth: Payer: Self-pay

## 2021-06-20 ENCOUNTER — Other Ambulatory Visit: Payer: Self-pay | Admitting: Physician Assistant

## 2021-06-20 ENCOUNTER — Encounter: Payer: Self-pay | Admitting: Physician Assistant

## 2021-06-20 DIAGNOSIS — J988 Other specified respiratory disorders: Secondary | ICD-10-CM

## 2021-06-20 MED ORDER — PSEUDOEPH-BROMPHEN-DM 30-2-10 MG/5ML PO SYRP: 5.0000 mL | ORAL_SOLUTION | Freq: Four times a day (QID) | ORAL | 0 refills | Status: DC | PRN

## 2021-06-20 NOTE — Telephone Encounter (Signed)
Cody [name] called to report that he tested positive for COVID last week. Today, his test remains positive with ongoing deep chest cough "from the lungs" and congestion, which was audible in conversation through the phone call today. Review with provider in agreement for removal from work due to symptoms and tele-health call with provider today at 415 pm.

## 2021-06-20 NOTE — Progress Notes (Signed)
Coughing, congestion and runny nose. Pt denies fever and body aches since Wednesday of last week./CL,RMA

## 2021-06-20 NOTE — Progress Notes (Signed)
   Subjective: Cough and chest congestion    Patient ID: Cody [name], male    DOB: April 08, 1971, 50 y.o.   MRN: [id number]  HPI Patient was diagnosed with COVID 19 8 days ago.  Patient states [name] of his signs and symptoms has resolved except for nasal congestion, cough, and chest congestion.   Review of Systems GERD, hyperlipidemia, erectile dysfunction, and sleep apnea.    Objective:   Physical Exam This is a virtual visit.       Assessment & Plan: Viral illness   Patient given prescription for Bromfed-DM and advised to follow-up in 2 days if no improvement or worsening complaint.

## 2021-06-23 ENCOUNTER — Other Ambulatory Visit: Payer: Self-pay

## 2021-06-23 ENCOUNTER — Encounter: Payer: Self-pay | Admitting: Emergency Medicine

## 2021-06-23 ENCOUNTER — Ambulatory Visit
Admission: EM | Admit: 2021-06-23 | Discharge: 2021-06-23 | Disposition: A | Discharge status: Home / Self Care | Payer: 59 | Attending: Internal Medicine | Admitting: Internal Medicine

## 2021-06-23 DIAGNOSIS — R1032 Left lower quadrant pain: Secondary | ICD-10-CM

## 2021-06-23 LAB — URINALYSIS, COMPLETE (UACMP) WITH MICROSCOPIC
Bilirubin Urine: NEGATIVE
Glucose, UA: NEGATIVE mg/dL
Leukocytes,Ua: NEGATIVE
Nitrite: NEGATIVE
Protein, ur: NEGATIVE mg/dL
Specific Gravity, Urine: >1.03 — ABNORMAL HIGH (ref 1.005–1.030)
pH: 5 (ref 5.0–8.0)

## 2021-06-23 MED ORDER — DOXYCYCLINE HYCLATE 100 MG PO CAPS
100.0000 mg | ORAL_CAPSULE | Freq: Two times a day (BID) | ORAL | 0 refills | Status: DC
Start: 2021-06-23 — End: 2021-08-10

## 2021-06-23 MED ORDER — METRONIDAZOLE 500 MG PO TABS: 500.0000 mg | ORAL_TABLET | Freq: Three times a day (TID) | ORAL | 0 refills | Status: DC

## 2021-06-23 NOTE — ED Triage Notes (Signed)
PT reports LUQ pain that started last night. Vomited 2x overnight. Pain has not moved or changed in nature. Had a normal BM this AM. History of inguinal hernia and diverticulitis.

## 2021-06-23 NOTE — Discharge Instructions (Signed)
We dont have CT today to make sure if your diagnosis, so since you want to try medication first, that is fine, but Go to ER if you get worse in the next 24 hours

## 2021-06-23 NOTE — ED Provider Notes (Signed)
[facility]    CSN: [id number] Arrival date & time: 06/23/21  1312      History   Chief Complaint Chief Complaint  Patient presents with   Abdominal Pain    HPI Cody [name] is a 50 y.o. male who presents with LUQ and mid abdominal pain since last night. The pain has been constant and getting worse. Started as sharp, and now is dull constant ache. Had  a small BM this am. Does not feel the same pain as when he had diverticulitis in the past. Denies having a fever. Denies urinary symptoms or flank pain    Past Medical History:  Diagnosis Date   Knee pain, left    Palpitations    Sleep apnea with use of continuous positive airway pressure (CPAP)    Vertigo     Patient Active Problem List   Diagnosis Date Noted   Sensorineural hearing loss (SNHL) of both ears 11/05/2019   Abnormal posture 07/15/2019   Brachial radiculitis 07/15/2019   DDD (degenerative disc disease), cervical 07/15/2019   Muscle weakness 07/15/2019   Neck pain 07/15/2019   Tobacco dependence 04/01/2019   Elevated BP without diagnosis of hypertension 04/01/2019   Class 1 obesity due to excess calories without serious comorbidity with body mass index (BMI) of 31.0 to 31.9 in adult 04/01/2019   Sprain of ankle 04/02/2018   Sleep apnea 02/27/2014   GERD (gastroesophageal reflux disease) 02/27/2014   Hyperlipidemia 02/27/2014    Past Surgical History:  Procedure Laterality Date   INGUINAL HERNIA REPAIR Bilateral    NASAL SEPTUM SURGERY         Home Medications    Prior to Admission medications   Medication Sig Start Date End Date Taking? Authorizing Provider  doxycycline (VIBRAMYCIN) 100 MG capsule Take 1 capsule (100 mg total) by mouth 2 (two) times daily. 06/23/21  Yes Rodriguez-Southworth, Nettie Elm, PA-C  metroNIDAZOLE (FLAGYL) 500 MG tablet Take 1 tablet (500 mg total) by mouth 3 (three) times daily. 06/23/21  Yes Rodriguez-Southworth, Nettie Elm, PA-C   brompheniramine-pseudoephedrine-DM 30-2-10 MG/5ML syrup Take 5 mLs by mouth 4 (four) times daily as needed. 06/20/21   Joni Reining, PA-C  buPROPion [facility] SR) 150 MG 12 hr tablet Take 1 tablet (150 mg total) by mouth 2 (two) times daily. For the first 3 days take 1 tablet daily; then increase to 1 tablet twice a day. 04/19/21   Joni Reining, PA-C  diclofenac Sodium (VOLTAREN) 1 % GEL Apply 2 g topically 4 (four) times daily. 07/30/20   Joni Reining, PA-C  oxymetazoline (AFRIN NASAL SPRAY) 0.05 % nasal spray Place 1 spray into both nostrils 2 (two) times daily. 09/06/20   Pricilla Handler, MD  tadalafil (CIALIS) 20 MG tablet 1 tab po daily prn ED, max is 1 tab per 24 hours 05/06/20   Tommi Rumps, PA-C    Family History Family History  Problem Relation Age of Onset   Heart disease Father    COPD Father    Lung cancer Father    Bladder Cancer Brother    Diabetes Maternal Grandmother     Social History Social History   Tobacco Use   Smoking status: Former   Smokeless tobacco: Current    Types: Chew  Substance Use Topics   Alcohol use: Yes    Comment: socially     Allergies   Amoxicillin-pot clavulanate, Levofloxacin, Nsaids, and Penicillins   Review of Systems Review of Systems  Constitutional:  Negative for  chills and fever.  Gastrointestinal:  Positive for abdominal pain. Negative for blood in stool, nausea and vomiting.  Genitourinary:  Negative for difficulty urinating and hematuria.  Musculoskeletal:  Negative for gait problem.    Physical Exam Triage Vital Signs ED Triage Vitals  Enc Vitals Group     BP 06/23/21 1328 (!) 152/93     Pulse Rate 06/23/21 1328 87     Resp 06/23/21 1328 16     Temp 06/23/21 1328 99 F (37.2 C)     Temp Source 06/23/21 1328 Oral     SpO2 06/23/21 1328 99 %     Weight --      Height --      Head Circumference --      Peak Flow --      Pain Score 06/23/21 1327 7     Pain Loc --      Pain Edu? --      Excl. in  [place]? --    No data found.  Updated Vital Signs BP (!) 152/93   Pulse 87   Temp 99 F (37.2 C) (Oral)   Resp 16   SpO2 99%   Visual Acuity Right Eye Distance:   Left Eye Distance:   Bilateral Distance:    Right Eye Near:   Left Eye Near:    Bilateral Near:     Physical Exam Vitals and nursing note reviewed.  Constitutional:      General: He is not in acute distress.    Appearance: He is not toxic-appearing.  Eyes:     General: No scleral icterus. Pulmonary:     Effort: Pulmonary effort is normal.  Abdominal:     General: Abdomen is flat. Bowel sounds are normal.     Palpations: Abdomen is soft.     Tenderness: There is abdominal tenderness in the left upper quadrant and left lower quadrant. There is guarding. There is no right CVA tenderness, left CVA tenderness or rebound. Negative signs include McBurney's sign and psoas sign.  Skin:    General: Skin is warm and dry.  Neurological:     Mental Status: He is alert and oriented to person, place, and time.  Psychiatric:        Mood and Affect: Mood normal.        Behavior: Behavior normal.     UC Treatments / Results  Labs (all labs ordered are listed, but only abnormal results are displayed) Labs Reviewed  URINALYSIS, COMPLETE (UACMP) WITH MICROSCOPIC - Abnormal; Notable for the following components:      Result Value   Specific Gravity, Urine >1.030 (*)    Hgb urine dipstick SMALL (*)    Ketones, ur TRACE (*)    Bacteria, UA FEW (*)    All other components within normal limits    EKG   Radiology No results found.  Procedures Procedures (including critical care time)  Medications Ordered in UC Medications - No data to display  Initial Impression / Assessment and Plan / UC Course  I have reviewed the triage vital signs and the nursing notes. Pertinent labs results that were available during my care of the patient were reviewed by me and considered in my medical decision making (see chart for  details). I reviewed his abd CT from 2021 and shows he has proximal sigmoid diverticulosis.  I also looked at his past UA's from the past 2 years and has had microscopic hematuria off and on, so today's small amount of  blood may be his normal.  Due to his low grade temp and where he has tenderness I suspect he may have diverticulitis.  We don't have CT right now, so pt was advised to go to ER vs trying medication to cover diverticulitis. He does not want to sit in ER for hours, but will talk with his wife and let me know if he goes to ER In the mean time I placed him on Doxy and Flagyl. Told if he opts to not go to ER today, if in 24 hours he gets worse, to go.     Final Clinical Impressions(s) / UC Diagnoses   Final diagnoses:  Abdominal pain, left lower quadrant     Discharge Instructions      We dont have CT today to make sure if your diagnosis, so since you want to try medication first, that is fine, but Go to ER if you get worse in the next 24 hours      ED Prescriptions     Medication Sig Dispense Auth. Provider   doxycycline (VIBRAMYCIN) 100 MG capsule Take 1 capsule (100 mg total) by mouth 2 (two) times daily. 20 capsule Rodriguez-Southworth, Sunday Spillers, PA-C   metroNIDAZOLE (FLAGYL) 500 MG tablet Take 1 tablet (500 mg total) by mouth 3 (three) times daily. 30 tablet Rodriguez-Southworth, Sunday Spillers, PA-C      PDMP not reviewed this encounter.   Shelby Mattocks, PA-C 06/23/21 1440

## 2021-06-24 DIAGNOSIS — Z720 Tobacco use: Secondary | ICD-10-CM | POA: Insufficient documentation

## 2021-06-24 DIAGNOSIS — Z8719 Personal history of other diseases of the digestive system: Secondary | ICD-10-CM | POA: Insufficient documentation

## 2021-06-24 DIAGNOSIS — K859 Acute pancreatitis without necrosis or infection, unspecified: Secondary | ICD-10-CM | POA: Insufficient documentation

## 2021-06-27 ENCOUNTER — Telehealth: Payer: Self-pay

## 2021-06-27 NOTE — Telephone Encounter (Signed)
Patient called to report he was admitted to [facility] over the weekend for Pancreatitis, possibly related to COVID virus. He has follow up with [facility] and will keep [place] notified of any further needs or updates.

## 2021-08-10 ENCOUNTER — Ambulatory Visit (INDEPENDENT_AMBULATORY_CARE_PROVIDER_SITE_OTHER): Payer: 59 | Admitting: Urology

## 2021-08-10 ENCOUNTER — Encounter: Payer: Self-pay | Admitting: Urology

## 2021-08-10 ENCOUNTER — Other Ambulatory Visit: Payer: Self-pay

## 2021-08-10 VITALS — BP 140/82 | HR 99 | Ht 69.0 in | Wt 200.0 lb

## 2021-08-10 DIAGNOSIS — R32 Unspecified urinary incontinence: Secondary | ICD-10-CM

## 2021-08-10 DIAGNOSIS — Z125 Encounter for screening for malignant neoplasm of prostate: Secondary | ICD-10-CM

## 2021-08-10 DIAGNOSIS — N410 Acute prostatitis: Secondary | ICD-10-CM

## 2021-08-10 LAB — BLADDER SCAN AMB NON-IMAGING

## 2021-08-10 MED ORDER — TAMSULOSIN HCL 0.4 MG PO CAPS: 0.4000 mg | ORAL_CAPSULE | Freq: Every day | ORAL | 11 refills | Status: DC

## 2021-08-10 MED ORDER — SULFAMETHOXAZOLE-TRIMETHOPRIM 800-160 MG PO TABS: 1.0000 | ORAL_TABLET | Freq: Two times a day (BID) | ORAL | 0 refills | Status: DC

## 2021-08-10 NOTE — Progress Notes (Signed)
08/10/21 10:23 AM   Cody [name] Sailors 05-09-1971 [id number]  CC: BPH, urinary symptoms, urgency  HPI: 51 year old male here today with about a month of worsening urinary symptoms with urgency, frequency, feeling of incomplete emptying, sensation of leakage without actual incontinence, and weak stream.  IPSS score today is 28, with quality of life unhappy.  He was unable to void for urinalysis, and PVR was normal at 16 mL.  He denies any major changes in medications or diet over the last month.  He was seen by [facility] around 10 years ago and told he had BPH.  He denies any pain.  Denies gross hematuria.  No prior PSA values to review.  He does have a family history of bladder cancer in his brother.  Urinalysis on 06/23/2021 was benign, and he thinks this may have been prior to his onset of worsening symptoms.  He has ED that is responsive to 10 to 20 mg Cialis on demand.  CT abdomen pelvis in June 2021 for abdominal pain showed mild diverticulitis, prostate measured 29 g and bladder was decompressed with no hydronephrosis or stones.   PMH: Past Medical History:  Diagnosis Date   Knee pain, left    Palpitations    Sleep apnea with use of continuous positive airway pressure (CPAP)    Vertigo     Surgical History: Past Surgical History:  Procedure Laterality Date   INGUINAL HERNIA REPAIR Bilateral    NASAL SEPTUM SURGERY       Family History: Family History  Problem Relation Age of Onset   Heart disease Father    COPD Father    Lung cancer Father    Bladder Cancer Brother    Diabetes Maternal Grandmother     Social History:  reports that he has quit smoking. His smokeless tobacco use includes chew. He reports current alcohol use. No history on file for drug use.  Physical Exam: BP 140/82    Pulse 99    Ht 5\' 9"  (1.753 m)    Wt 200 lb (90.7 kg)    BMI 29.53 kg/m    Constitutional:  Alert and oriented, No acute distress. Cardiovascular: No clubbing, cyanosis, or  edema. Respiratory: Normal respiratory effort, no increased work of breathing. GI: Abdomen is soft, nontender, nondistended, no abdominal masses GU: Circumcised phallus with patent meatus, no lesions, testicles 20 cc and descended bilaterally without masses, nontender DRE: 30 g, smooth, no nodules or masses  Laboratory Data: Reviewed, see HPI  Pertinent Imaging: I have personally viewed and interpreted the CT abdomen pelvis in June 2021 for abdominal pain showed mild diverticulitis, prostate measured 29 g and bladder was decompressed with no hydronephrosis or stones reviewed, see HPI  Assessment & Plan:   51 year old male with worsening urinary symptoms over the last month with urgency, frequency, weak stream, feeling of incomplete emptying.  Unable to void for urinalysis today, and PVR normal at 16 mL, urinalysis completely benign on 06/23/2021.  We discussed possible etiologies at length including prostatitis, BPH, urethral stricture, or less likely malignancy like prostate or bladder cancer.  I recommended trial of Bactrim DS twice daily x10 days for possible prostatitis along with Flomax to see how his urinary symptoms respond, with close follow-up in 4 weeks.  We will schedule this as a cystoscopy, but if symptoms have resolved, okay to change to clinic visit.  PSA drawn today and will call with results   Nickolas Madrid, MD 08/10/2021  [facility] [address]  [place], [place] [address] [phone]

## 2021-08-10 NOTE — Addendum Note (Signed)
Addended by: Despina Hidden on: 08/10/2021 12:59 PM   Modules accepted: Orders

## 2021-08-10 NOTE — Patient Instructions (Signed)
Prostatitis °Prostatitis is swelling or inflammation of the prostate gland, also called the prostate. This gland is about 1.5 inches wide and 1 inch high, and it is involved in making semen. The prostate is located below a man's bladder, in front of the rectum. °There are four types of prostatitis: °Chronic prostatitis (CP), also called chronic pelvic pain syndrome (CPPS). This is the most common type of prostatitis. It is associated with increased muscle tone in the area between the hip bones (pelvic area), around the prostate. This type is also known as a pelvic floor disorder. °Chronic bacterial prostatitis. This type usually results from an acute bacterial infection in the prostate gland that keeps coming back or has not been treated properly. The symptoms are less severe than those caused by acute bacterial prostatitis, which lasts a shorter time. °Asymptomatic inflammatory prostatitis. This type does not have symptoms and does not need treatment. This is diagnosed when tests are done for other disorders of the urinary tract or reproductive tract. °Acute bacterial prostatitis. This type starts quickly and results from an acute bacterial infection in the prostate gland. It is usually associated with a bladder infection, high fever, and chills. This is the least common type of prostatitis. °What are the causes? °Bacterial prostatitis is caused by an infection from bacteria. °Chronic nonbacterial prostatitis may be caused by: °Factors related to the nervous system. This system includes thebrain, spinal cord, and nerves. °An autoimmune response. This happens when the body's disease-fighting system attacks healthy tissue in the body by mistake. °Psychological factors. These have to do with how the mind works. °The causes of the other types of prostatitis are usually not known. °What are the signs or symptoms? °Symptoms of this condition depend on the type of prostatitis you have. °Acute bacterial  prostatitis °Symptoms may include: °Pain or burning during urination. °Frequent and sudden urges to urinate. °Trouble starting to urinate. °Fever. °Chills. °Pain in your muscles or joints, lower back, or lower abdomen. °Other types of prostatitis °Symptoms may include: °Sudden urges to urinate, or urinating often. °Trouble starting to urinate. °Weak urine stream. °Dribbling after urination. °Discharge coming from the penis. °Pain in the testicles, the penis, or the tip of the penis. °Pain in the area in front of the rectum and below the scrotum (perineum). °Pain when ejaculating. °How is this diagnosed? °This condition may be diagnosed based on: °A physical and medical exam. °A digital rectal exam. For this, the health care provider may use a finger to feel the prostate. °A urine test to check for bacteria. °A semen sample or blood tests. °Ultrasound. °Urodynamic tests to check how your body handles urine. °Cystoscopy to look inside your bladder or inside the part of your body that drains urine from the bladder (urethra). °How is this treated? °Treatment for this condition depends on the type of prostatitis. Treatment may involve: °Medicines to relieve pain or inflammation, or to help relax your muscles. °Physical therapy. °Heat therapy. °Biofeedback. These techniques help you control certain body functions. °Relaxation exercises. °Antibiotic medicine, if your condition is caused by bacteria. °Sitz baths. These warm water baths help to relax your pelvic floor muscles, which helps to relieve pressure on the prostate. °Follow these instructions at home: °Medicines °Take over-the-counter and prescription medicines only as told by your health care provider. °If you were prescribed an antibiotic medicine, take it as told by your health care provider. Do not stop using the antibiotic even if you start to feel better. °Managing pain and swelling ° °  Take sitz baths as directed by your health care provider. For a sitz bath,  sit in warm water that is deep enough to cover your hips and buttocks. If directed, apply heat to the affected area as often as told by your health care provider. Use the heat source that your health care provider recommends, such as a moist heat pack or a heating pad. Place a towel between your skin and the heat source. Leave the heat on for 20-30 minutes. Remove the heat if your skin turns bright red. This is especially important if you are unable to feel pain, heat, or cold. You may have a greater risk of getting burned. General instructions Do exercises as told by your health care provider, if you were prescribed physical therapy, biofeedback, or relaxation exercises. Keep all follow-up visits as told by your health care provider. This is important. Where to find more information [employer] of Diabetes and Digestive and Kidney Diseases: [employer] Contact a health care provider if: Your symptoms get worse. You have a fever. Get help right away if: You have chills. You feel light-headed or feel like you may faint. You cannot urinate. You have blood or blood clots in your urine. Summary Prostatitis is swelling or inflammation of the prostate gland. Treatment for this condition depends on the type of prostatitis. Take over-the-counter and prescription medicines only as told by your health care provider. Get help right away of you have chills, feel light-headed, feel like you may faint, cannot urinate, or have blood or blood clots in your urine. This information is not intended to replace advice given to you by your health care provider. Make sure you discuss any questions you have with your health care provider. Document Revised: 08/15/2019 Document Reviewed: 08/15/2019 Elsevier Patient Education  2022 [employer]  Benign Prostatic Hyperplasia Benign prostatic hyperplasia (BPH) is an enlarged prostate gland that is caused by the normal aging process. The  prostate may get bigger as a man gets older. The condition is not caused by cancer. The prostate is a walnut-sized gland that is involved in the production of semen. It is located in front of the rectum and below the bladder. The bladder stores urine. The urethra carries stored urine out of the body. An enlarged prostate can press on the urethra. This can make it harder to pass urine. The buildup of urine in the bladder can cause infection. Back pressure and infection may progress to bladder damage and kidney (renal) failure. What are the causes? This condition is part of the normal aging process. However, not all men develop problems from this condition. If the prostate enlarges away from the urethra, urine flow will not be blocked. If it enlarges toward the urethra and compresses it, there will be problems passing urine. What increases the risk? This condition is more likely to develop in men older than 50 years. What are the signs or symptoms? Symptoms of this condition include: Getting up often during the night to urinate. Needing to urinate frequently during the day. Difficulty starting urine flow. Decrease in size and strength of your urine stream. Leaking (dribbling) after urinating. Inability to pass urine. This needs immediate treatment. Inability to completely empty your bladder. Pain when you pass urine. This is more common if there is also an infection. Urinary tract infection (UTI). How is this diagnosed? This condition is diagnosed based on your medical history, a physical exam, and your symptoms. Tests will also be done, such as: A post-void bladder scan.  This measures any amount of urine that may remain in your bladder after you finish urinating. A digital rectal exam. In a rectal exam, your health care provider checks your prostate by putting a lubricated, gloved finger into your rectum to feel the back of your prostate gland. This exam detects the size of your gland and any  abnormal lumps or growths. An exam of your urine (urinalysis). A prostate specific antigen (PSA) screening. This is a blood test used to screen for prostate cancer. An ultrasound. This test uses sound waves to electronically produce a picture of your prostate gland. Your health care provider may refer you to a specialist in kidney and prostate diseases (urologist). How is this treated? Once symptoms begin, your health care provider will monitor your condition (active surveillance or watchful waiting). Treatment for this condition will depend on the severity of your condition. Treatment may include: Observation and yearly exams. This may be the only treatment needed if your condition and symptoms are mild. Medicines to relieve your symptoms, including: Medicines to shrink the prostate. Medicines to relax the muscle of the prostate. Surgery in severe cases. Surgery may include: Prostatectomy. In this procedure, the prostate tissue is removed completely through an open incision or with a laparoscope or robotics. Transurethral resection of the prostate (TURP). In this procedure, a tool is inserted through the opening at the tip of the penis (urethra). It is used to cut away tissue of the inner core of the prostate. The pieces are removed through the same opening of the penis. This removes the blockage. Transurethral incision (TUIP). In this procedure, small cuts are made in the prostate. This lessens the prostate's pressure on the urethra. Transurethral microwave thermotherapy (TUMT). This procedure uses microwaves to create heat. The heat destroys and removes a small amount of prostate tissue. Transurethral needle ablation (TUNA). This procedure uses radio frequencies to destroy and remove a small amount of prostate tissue. Interstitial laser coagulation (ILC). This procedure uses a laser to destroy and remove a small amount of prostate tissue. Transurethral electrovaporization (TUVP). This procedure  uses electrodes to destroy and remove a small amount of prostate tissue. Prostatic urethral lift. This procedure inserts an implant to push the lobes of the prostate away from the urethra. Follow these instructions at home: Take over-the-counter and prescription medicines only as told by your health care provider. Monitor your symptoms for any changes. Contact your health care provider with any changes. Avoid drinking large amounts of liquid before going to bed or out in public. Avoid or reduce how much caffeine or alcohol you drink. Give yourself time when you urinate. Keep all follow-up visits. This is important. Contact a health care provider if: You have unexplained back pain. Your symptoms do not get better with treatment. You develop side effects from the medicine you are taking. Your urine becomes very dark or has a bad smell. Your lower abdomen becomes distended and you have trouble passing urine. Get help right away if: You have a fever or chills. You suddenly cannot urinate. You feel light-headed or very dizzy, or you faint. There are large amounts of blood or clots in your urine. Your urinary problems become hard to manage. You develop moderate to severe low back or flank pain. The flank is the side of your body between the ribs and the hip. These symptoms may be an emergency. Get help right away. Call 911. Do not wait to see if the symptoms will go away. Do not drive yourself to  the hospital. Summary Benign prostatic hyperplasia (BPH) is an enlarged prostate that is caused by the normal aging process. It is not caused by cancer. An enlarged prostate can press on the urethra. This can make it hard to pass urine. This condition is more likely to develop in men older than 50 years. Get help right away if you suddenly cannot urinate. This information is not intended to replace advice given to you by your health care provider. Make sure you discuss any questions you have with  your health care provider. Document Revised: 01/26/2021 Document Reviewed: 01/26/2021 Elsevier Patient Education  2022 [employer]

## 2021-08-11 LAB — PSA TOTAL (REFLEX TO FREE): Prostate Specific Ag, Serum: 2.2 ng/mL (ref 0.0–4.0)

## 2021-09-03 NOTE — Telephone Encounter (Signed)
Glad he is feeling better, follow-up visit can be office with IPSS and PVR, thanks  Legrand Rams, MD 09/03/2021

## 2021-09-14 ENCOUNTER — Other Ambulatory Visit: Payer: Self-pay

## 2021-09-14 ENCOUNTER — Ambulatory Visit (INDEPENDENT_AMBULATORY_CARE_PROVIDER_SITE_OTHER): Payer: 59 | Admitting: Urology

## 2021-09-14 ENCOUNTER — Encounter: Payer: Self-pay | Admitting: Urology

## 2021-09-14 VITALS — BP 123/67 | HR 81 | Ht 69.0 in | Wt 213.0 lb

## 2021-09-14 DIAGNOSIS — N410 Acute prostatitis: Secondary | ICD-10-CM | POA: Diagnosis not present

## 2021-09-14 DIAGNOSIS — N138 Other obstructive and reflux uropathy: Secondary | ICD-10-CM

## 2021-09-14 DIAGNOSIS — N401 Enlarged prostate with lower urinary tract symptoms: Secondary | ICD-10-CM | POA: Diagnosis not present

## 2021-09-14 DIAGNOSIS — Z125 Encounter for screening for malignant neoplasm of prostate: Secondary | ICD-10-CM

## 2021-09-14 LAB — BLADDER SCAN AMB NON-IMAGING

## 2021-09-14 NOTE — Progress Notes (Signed)
° °  09/14/2021 10:42 AM   Cody [name] [birth year] [id number]  Reason for visit: Follow up urinary symptoms, prostatitis, PSA screening  HPI: 51 year old male who I saw in mid January 2023 for 4 weeks of worsening urinary symptoms with urgency, frequency, feeling of incomplete emptying, weak stream.  He was unable to give a urinalysis and PVR was normal.  His symptoms were more consistent with prostatitis and BPH, and he was treated with a 10-day course of Bactrim as well as Flomax.  This completely resolved his symptoms and he denies any urinary complaints today and is doing very well.  He continues to take the Flomax.  I recommended considering stopping the Flomax to see if he really needs to be on this, but he can certainly continue the Flomax if he feels it is improving the urination.  Return precautions were discussed extensively.  PSA was also checked at her last visit and was normal at 2.2.  Okay to discontinue Flomax-> can resume if worsening of symptoms off the medication RTC 1 year IPSS, PVR, symptom check, can consider as needed follow-up if doing well at that time   Sondra Come, MD  [facility] [address], [address] [place], [place] [address] [phone]

## 2021-09-14 NOTE — Patient Instructions (Signed)

## 2021-10-12 DIAGNOSIS — N529 Male erectile dysfunction, unspecified: Secondary | ICD-10-CM

## 2021-10-13 ENCOUNTER — Other Ambulatory Visit: Payer: Self-pay

## 2021-10-13 DIAGNOSIS — N529 Male erectile dysfunction, unspecified: Secondary | ICD-10-CM

## 2021-10-13 MED ORDER — TADALAFIL 20 MG PO TABS: 20.0000 mg | ORAL_TABLET | ORAL | 11 refills | Status: DC | PRN

## 2021-10-13 NOTE — Telephone Encounter (Signed)
See my chart message

## 2021-10-27 ENCOUNTER — Encounter: Payer: Self-pay | Admitting: Physician Assistant

## 2021-10-27 ENCOUNTER — Ambulatory Visit
Admission: RE | Admit: 2021-10-27 | Discharge: 2021-10-27 | Disposition: A | Discharge status: Home / Self Care | Payer: 59 | Attending: Physician Assistant | Admitting: Physician Assistant

## 2021-10-27 ENCOUNTER — Ambulatory Visit
Admission: RE | Admit: 2021-10-27 | Discharge: 2021-10-27 | Disposition: A | Discharge status: Home / Self Care | Payer: 59 | Source: Ambulatory Visit | Attending: Physician Assistant | Admitting: Physician Assistant

## 2021-10-27 ENCOUNTER — Ambulatory Visit: Payer: Self-pay | Admitting: Physician Assistant

## 2021-10-27 DIAGNOSIS — M25571 Pain in right ankle and joints of right foot: Secondary | ICD-10-CM | POA: Insufficient documentation

## 2021-10-27 NOTE — Progress Notes (Signed)
? ?  Subjective: Right foot pain  ? ? Patient ID: Cody [name], male    DOB: [birth year], 51 y.o.   MRN: [id number] ? ?HPI ?Patient presents for edema and pain to the dorsal aspect of right foot secondary to contusion which occurred last night.  Patient denies loss sensation is able to bear weight.  Patient stated discomfort with wearing shoes. ? ? ?Review of Systems ?GERD, hyperlipidemia, and sleep apnea. ?   ?Objective:  ? Physical Exam ?No obvious deformity to the dorsal aspect of the right foot.  Patient has moderate guarding palpation mid second metatarsal.  Neurovascular intact.  Free Nikkel range of motion. ? ? ? ?   ?Assessment & Plan: Foot contusion versus metatarsal fracture  ? ? ?

## 2021-10-27 NOTE — Progress Notes (Signed)
Pt went to kick at dog to get it away from something ended up kicking someithing else due to being in the dark. Top of right foot is swollen.  ?

## 2021-12-03 IMAGING — CR DG SHOULDER 2+V*R*
1 series · 3 of 3 positions shown · non-contrast
Comparison: None.

CLINICAL DATA: 1 month of nontraumatic G/H right shoulder pain

No known injury
EXAM:
RIGHT SHOULDER - 2+ VIEW

[Series 1: dg shoulder right · 0.14mm/px · 3 of 3 slices shown]
[im 1/3]
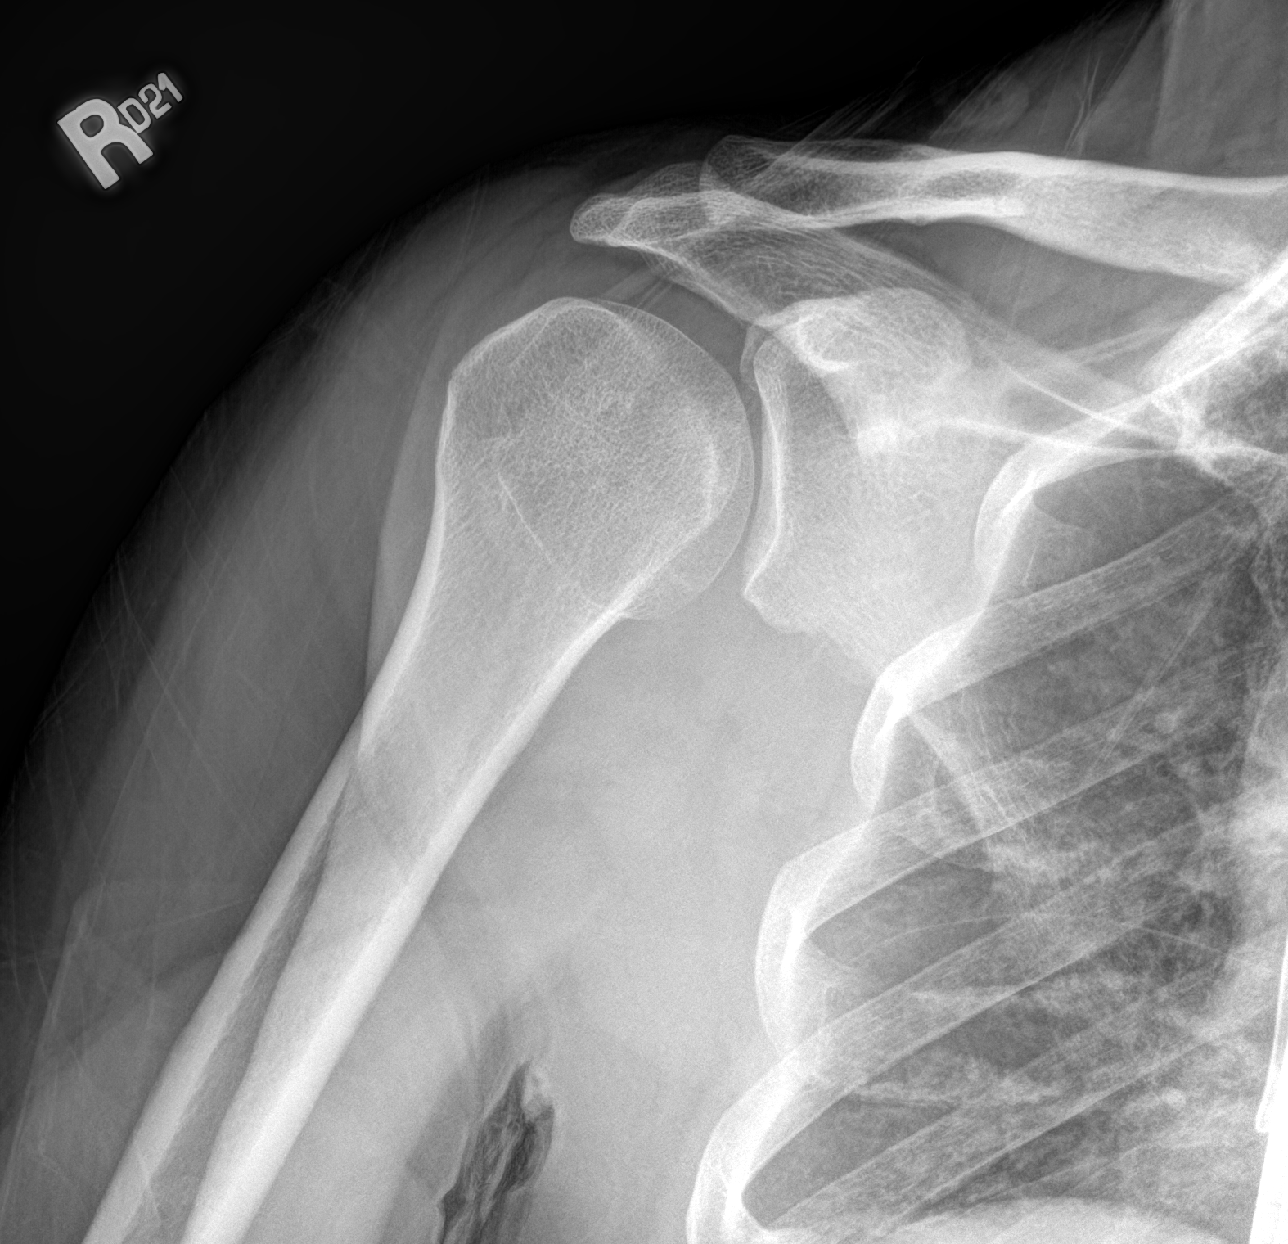
[im 2/3]
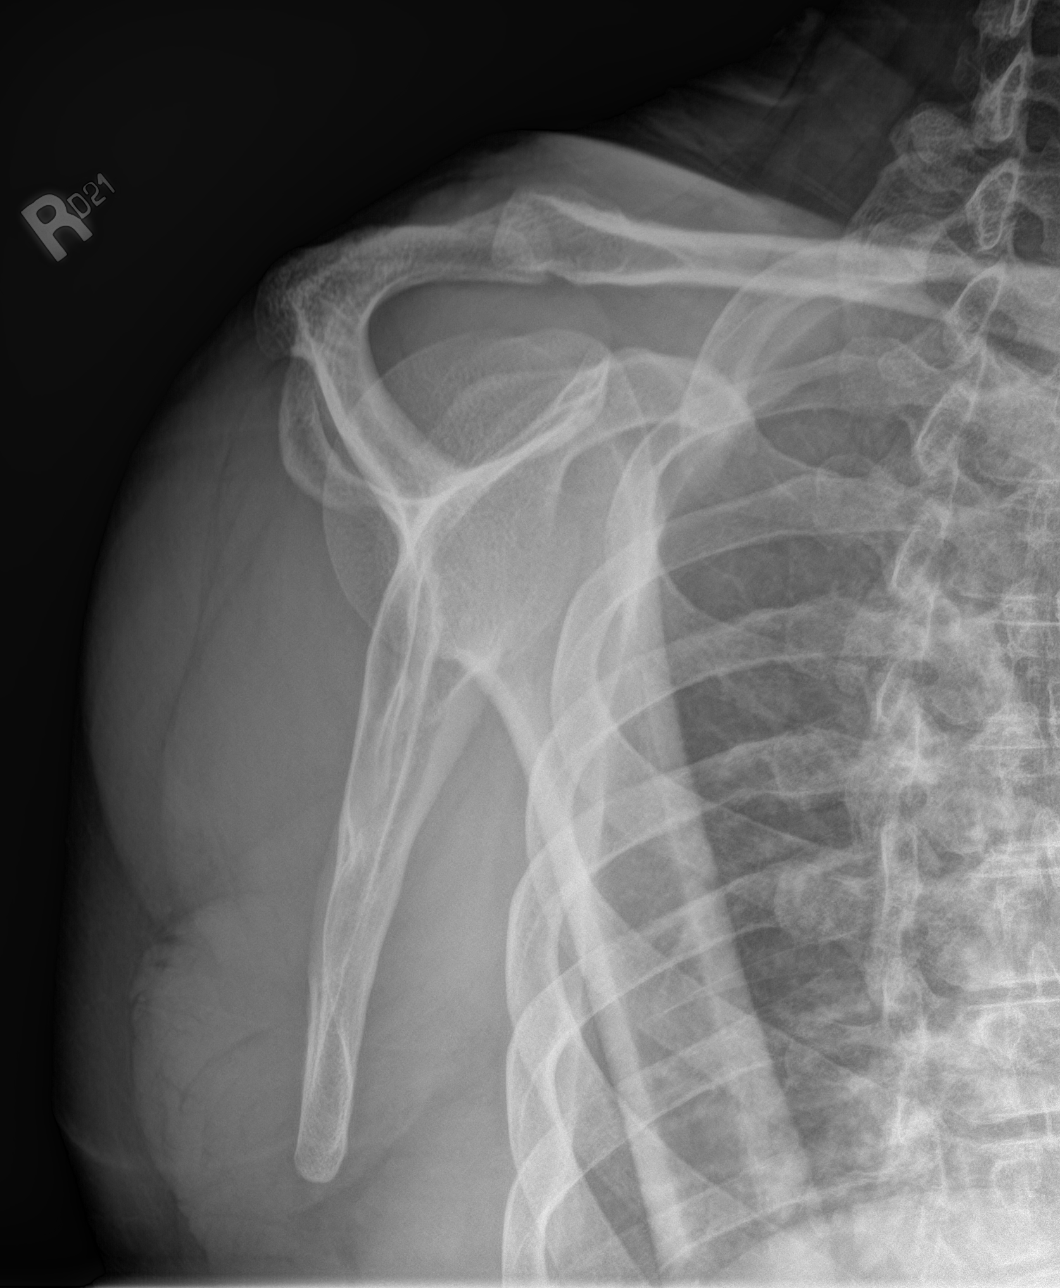
[im 3/3]
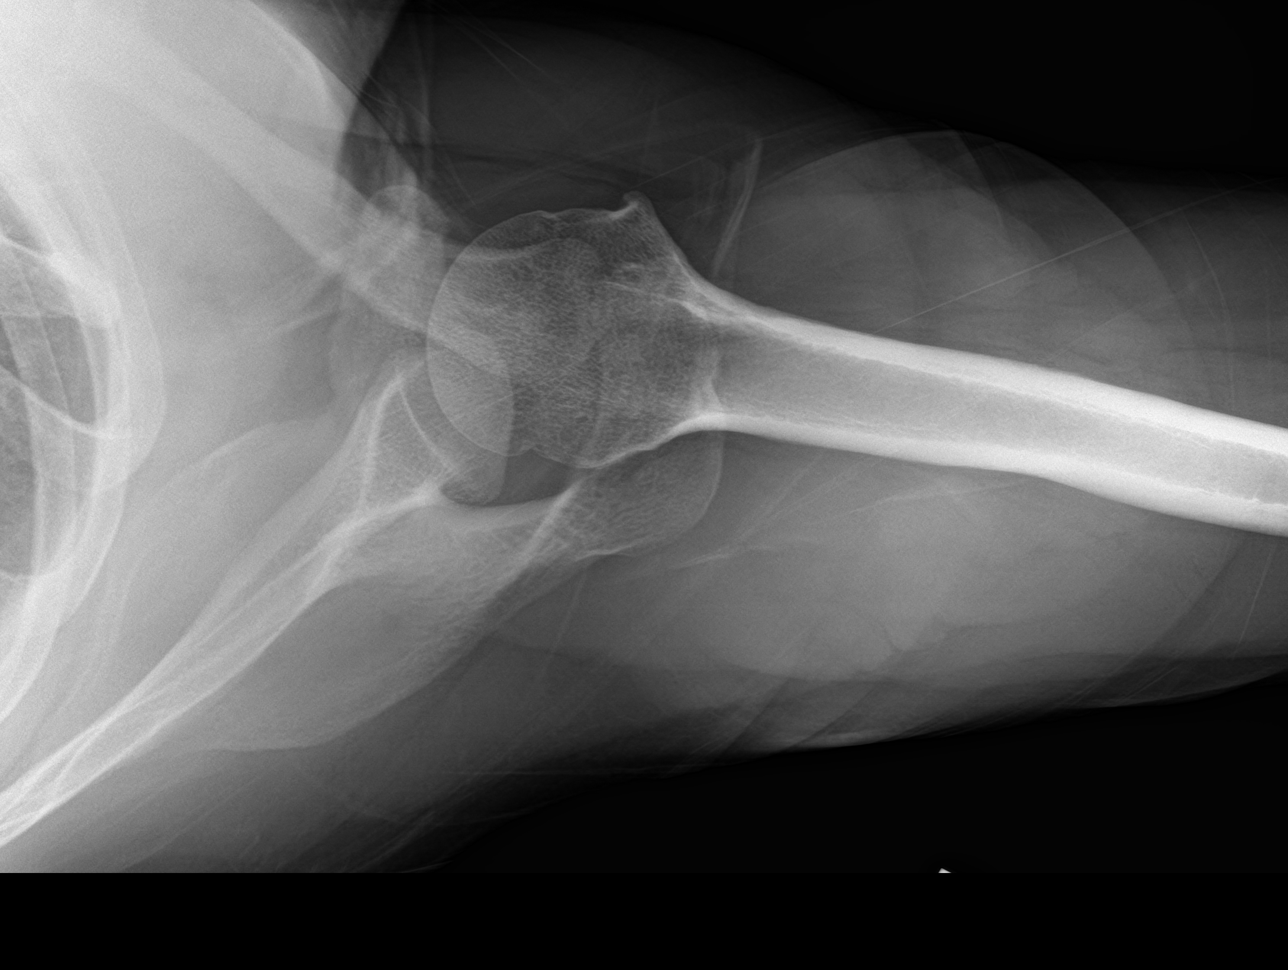

[3 of 3 positions shown; findings below may reference images not displayed]

FINDINGS: No fracture or dislocation.

Spurring noted at the lesser tuberosity suspicious for subscapularis
tendinopathy.
IMPRESSION: 1. No acute abnormality of the right shoulder.
2. Degenerative changes of the right shoulder as above.

## 2022-04-06 ENCOUNTER — Ambulatory Visit: Payer: Self-pay

## 2022-04-06 DIAGNOSIS — Z0000 Encounter for general adult medical examination without abnormal findings: Secondary | ICD-10-CM

## 2022-04-06 LAB — POCT URINALYSIS DIPSTICK
Bilirubin, UA: NEGATIVE
Blood, UA: POSITIVE
Glucose, UA: NEGATIVE
Ketones, UA: NEGATIVE
Leukocytes, UA: NEGATIVE
Nitrite, UA: NEGATIVE
Protein, UA: NEGATIVE
Spec Grav, UA: >=1.03 — AB (ref 1.010–1.025)
Urobilinogen, UA: 0.2 E.U./dL
pH, UA: 6 (ref 5.0–8.0)

## 2022-04-06 NOTE — Progress Notes (Signed)
Pt presents today for Fire physical labs, will return to clinic for scheduled physical./CL,RMA 

## 2022-04-07 LAB — CMP12+LP+TP+TSH+6AC+PSA+CBC…
ALT: 30 IU/L (ref 0–44)
AST: 14 IU/L (ref 0–40)
Albumin/Globulin Ratio: 1.6 (ref 1.2–2.2)
Albumin: 4.2 g/dL (ref 4.1–5.1)
Alkaline Phosphatase: 85 IU/L (ref 44–121)
BUN/Creatinine Ratio: 11 (ref 9–20)
BUN: 13 mg/dL (ref 6–24)
Basophils Absolute: 0 10*3/uL (ref 0.0–0.2)
Basos: 1 %
Bilirubin Total: 0.7 mg/dL (ref 0.0–1.2)
Calcium: 9.1 mg/dL (ref 8.7–10.2)
Chloride: 102 mmol/L (ref 96–106)
Chol/HDL Ratio: 4 ratio (ref 0.0–5.0)
Cholesterol, Total: 178 mg/dL (ref 100–199)
Creatinine, Ser: 1.18 mg/dL (ref 0.76–1.27)
EOS (ABSOLUTE): 0.1 10*3/uL (ref 0.0–0.4)
Eos: 2 %
Estimated CHD Risk: 0.8 times avg. (ref 0.0–1.0)
Free Thyroxine Index: 1.8 (ref 1.2–4.9)
GGT: 26 IU/L (ref 0–65)
Globulin, Total: 2.6 g/dL (ref 1.5–4.5)
Glucose: 97 mg/dL (ref 70–99)
HDL: 44 mg/dL (ref >39)
Hematocrit: 43.7 % (ref 37.5–51.0)
Hemoglobin: 14.9 g/dL (ref 13.0–17.7)
Immature Grans (Abs): 0 10*3/uL (ref 0.0–0.1)
Immature Granulocytes: 0 %
Iron: 175 ug/dL — ABNORMAL HIGH (ref 38–169)
LDH: 149 IU/L (ref 121–224)
LDL Chol Calc (NIH): 117 mg/dL — ABNORMAL HIGH (ref 0–99)
Lymphocytes Absolute: 1.7 10*3/uL (ref 0.7–3.1)
Lymphs: 31 %
MCH: 31.8 pg (ref 26.6–33.0)
MCHC: 34.1 g/dL (ref 31.5–35.7)
MCV: 93 fL (ref 79–97)
Monocytes Absolute: 0.6 10*3/uL (ref 0.1–0.9)
Monocytes: 10 %
Neutrophils Absolute: 3.1 10*3/uL (ref 1.4–7.0)
Neutrophils: 56 %
Phosphorus: 2.4 mg/dL — ABNORMAL LOW (ref 2.8–4.1)
Platelets: 206 10*3/uL (ref 150–450)
Potassium: 4.1 mmol/L (ref 3.5–5.2)
Prostate Specific Ag, Serum: 1.5 ng/mL (ref 0.0–4.0)
RBC: 4.68 x10E6/uL (ref 4.14–5.80)
RDW: 13 % (ref 11.6–15.4)
Sodium: 139 mmol/L (ref 134–144)
T3 Uptake Ratio: 29 % (ref 24–39)
T4, Total: 6.2 ug/dL (ref 4.5–12.0)
TSH: 2.62 u[IU]/mL (ref 0.450–4.500)
Total Protein: 6.8 g/dL (ref 6.0–8.5)
Triglycerides: 95 mg/dL (ref 0–149)
Uric Acid: 7.4 mg/dL (ref 3.8–8.4)
VLDL Cholesterol Cal: 17 mg/dL (ref 5–40)
WBC: 5.5 10*3/uL (ref 3.4–10.8)
eGFR: 75 mL/min/{1.73_m2} (ref >59)

## 2022-04-13 ENCOUNTER — Encounter: Payer: Self-pay | Admitting: Physician Assistant

## 2022-04-13 ENCOUNTER — Ambulatory Visit: Payer: Self-pay | Admitting: Physician Assistant

## 2022-04-13 VITALS — BP 130/76 | HR 78 | Temp 97.6°F | Resp 14 | Ht 69.0 in | Wt 224.0 lb

## 2022-04-13 DIAGNOSIS — Z0000 Encounter for general adult medical examination without abnormal findings: Secondary | ICD-10-CM

## 2022-04-13 NOTE — Progress Notes (Signed)
[facility]  ____________________________________________   None    (approximate)  I have reviewed the triage vital signs and the nursing notes.   HISTORY  Chief Complaint Annual Exam   HPI Cody [name] is a 51 y.o. 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  Patient voiced no concerns or complaints.         Past Medical History:  Diagnosis Date   Knee pain, left    Palpitations    Sleep apnea with use of continuous positive airway pressure (CPAP)    Vertigo     Patient Active Problem List   Diagnosis Date Noted   Sensorineural hearing loss (SNHL) of both ears 11/05/2019   Abnormal posture 07/15/2019   Brachial radiculitis 07/15/2019   DDD (degenerative disc disease), cervical 07/15/2019   Muscle weakness 07/15/2019   Neck pain 07/15/2019   Tobacco dependence 04/01/2019   Elevated BP without diagnosis of hypertension 04/01/2019   Class 1 obesity due to excess calories without serious comorbidity with body mass index (BMI) of 31.0 to 31.9 in adult 04/01/2019   Sprain of ankle 04/02/2018   Sleep apnea 02/27/2014   GERD (gastroesophageal reflux disease) 02/27/2014   Hyperlipidemia 02/27/2014    Past Surgical History:  Procedure Laterality Date   INGUINAL HERNIA REPAIR Bilateral    NASAL SEPTUM SURGERY      Prior to Admission medications   Medication Sig Start Date End Date Taking? Authorizing Provider  buPROPion (WELLBUTRIN SR) 150 MG 12 hr tablet Take 1 tablet (150 mg total) by mouth 2 (two) times daily. For the first 3 days take 1 tablet daily; then increase to 1 tablet twice a day. 04/19/21  Yes Sable Feil, PA-C  diclofenac Sodium (VOLTAREN) 1 % GEL Apply 2 g topically 4 (four) times daily. 07/30/20  Yes Sable Feil, PA-C  oxymetazoline (AFRIN NASAL SPRAY) 0.05 % nasal spray Place 1 spray into both nostrils 2 (two) times daily. 09/06/20  Yes Cecil Cobbs, MD  tadalafil (CIALIS) 20 MG tablet Take 1  tablet (20 mg total) by mouth as needed for erectile dysfunction. 1 tab po daily prn ED, max is 1 tab per 24 hours 10/13/21  Yes Billey Co, MD  tamsulosin (FLOMAX) 0.4 MG CAPS capsule Take 1 capsule (0.4 mg total) by mouth daily. Patient not taking: Reported on 04/13/2022 08/10/21   Billey Co, MD    Allergies Amoxicillin-pot clavulanate, Levofloxacin, Nsaids, and Penicillins  Family History  Problem Relation Age of Onset   Heart disease Father    COPD Father    Lung cancer Father    Bladder Cancer Brother    Diabetes Maternal Grandmother     Social History Social History   Tobacco Use   Smoking status: Former   Smokeless tobacco: Current    Types: Chew  Substance Use Topics   Alcohol use: Yes    Comment: socially   Drug use: Never    Review of Systems  Constitutional: No fever/chills Eyes: No visual changes. ENT: No sore throat. Cardiovascular: Denies chest pain. Respiratory: Denies shortness of breath. Gastrointestinal: No abdominal pain.  No nausea, no vomiting.  No diarrhea.  No constipation.  GERD, Genitourinary: Negative for dysuria.  Erectile dysfunction. Musculoskeletal: Negative for back pain. Skin: Negative for rash. Neurological: Negative for headaches, focal weakness or numbness. Allergic/Immunilogical: Augmentin, Levaquin, NSAIDsand penicillin. ____________________________________________   PHYSICAL EXAM:  VITAL SIGNS: BP is 130/76, pulse 78, respiration 14, temperature 97.6, patient 96% O2  sat on room air.  Patient weighs 224 pounds and BMI is 34.08. Constitutional: Alert and oriented. Well appearing and in no acute distress. Eyes: Conjunctivae are normal. PERRL. EOMI. Head: Atraumatic. Nose: No congestion/rhinnorhea. Mouth/Throat: Mucous membranes are moist.  Oropharynx non-erythematous. Neck: No stridor.  No cervical spine tenderness to palpation. Hematological/Lymphatic/Immunilogical: No cervical lymphadenopathy. Cardiovascular:  Normal rate, regular rhythm. Grossly normal heart sounds.  Good peripheral circulation. Respiratory: Normal respiratory effort.  No retractions. Lungs CTAB. Gastrointestinal: Soft and nontender. No distention. No abdominal bruits. No CVA tenderness. Genitourinary: Deferred Musculoskeletal: No lower extremity tenderness nor edema.  No joint effusions. Neurologic:  Normal speech and language. No gross focal neurologic deficits are appreciated. No gait instability. Skin:  Skin is warm, dry and intact. No rash noted. Psychiatric: Mood and affect are normal. Speech and behavior are normal.  ____________________________________________   LABS          Component Ref Range & Units 7 d ago (04/06/22) 9 mo ago (06/23/21) 1 yr ago (03/22/21) 1 yr ago (04/28/20) 2 yr ago (12/24/19) 2 yr ago (05/13/19)  Color, UA  Orange   yellow  Dark Yellow  Yellow  Dark yellow   Clarity, UA  Clear   clear  Clear  Clear  clear   Glucose, UA Negative Negative   Negative  Negative  Negative  Negative   Bilirubin, UA  Negative   negative  Negative  Negative  neg   Ketones, UA  Negative   negative  Negative  Negative  neg   Spec Grav, UA 1.010 - 1.025 >=1.030 Abnormal    >=1.030 Abnormal   >=1.030 Abnormal   1.020  >=1.030 Abnormal    Blood, UA  Positive   TRACE CM  Negative  Positive CM  Positive CM   Comment: 1+  pH, UA 5.0 - 8.0 6.0   5.5  5.5  6.0  5.5   Protein, UA Negative Negative   Positive Abnormal  CM  Negative  Negative  Positive Abnormal  CM   Urobilinogen, UA 0.2 or 1.0 E.U./dL 0.2   0.2  0.2  0.2  0.2   Nitrite, UA  Negative   negative  Negative  Negative  neg   Leukocytes, UA Negative Negative   Negative  Negative  Negative  Negative   Appearance   CLEAR R  dark         Odor             Resulting Agency   [facility]                      Other Results from 04/06/2022   Contains abnormal data CMP12+LP+TP+TSH+6AC+PSA+CBC. Order: [id number] Status: Final result    Visible to patient: Yes (seen)     Next appt: 05/03/2022 at 08:15 AM in [facility] ([facility])    Dx: Annual physical exam    0 Result Notes          Component Ref Range & Units 7 d ago (04/06/22) 8 mo ago (08/10/21) 1 yr ago (03/22/21) 1 yr ago (04/28/20) 8 yr ago (04/22/13) 8 yr ago (04/22/13)  Glucose 70 - 99 mg/dL 97   133 High  R  127 High  R  137 High  R    Uric Acid 3.8 - 8.4 mg/dL 7.4   6.7 CM  8.1 CM     Comment:            Therapeutic target  for gout patients: <6.0  BUN 6 - 24 mg/dL '13   16  15  14 ' R    Creatinine, Ser 0.76 - 1.27 mg/dL 1.18   1.13  1.35 High   1.25 R    eGFR >59 mL/min/1.73 75   80      BUN/Creatinine Ratio 9 - '20 11   14  11     ' Sodium 134 - 144 mmol/L 139   140  140  138 R    Potassium 3.5 - 5.2 mmol/L 4.1   4.0  3.9  3.7 R    Chloride 96 - 106 mmol/L 102   102  103  105 R    Calcium 8.7 - 10.2 mg/dL 9.1   9.3  9.2  9.0 R    Phosphorus 2.8 - 4.1 mg/dL 2.4 Low    2.5 Low   3.0     Total Protein 6.0 - 8.5 g/dL 6.8   6.5  7.2     Albumin 4.1 - 5.1 g/dL 4.2   4.4 R  4.4 R     Globulin, Total 1.5 - 4.5 g/dL 2.6   2.1  2.8     Albumin/Globulin Ratio 1.2 - 2.2 1.6   2.1  1.6     Bilirubin Total 0.0 - 1.2 mg/dL 0.7   0.5  0.6     Alkaline Phosphatase 44 - 121 IU/L 85   83  91 CM     LDH 121 - 224 IU/L 149   136  145     AST 0 - 40 IU/L '14   14  19     ' ALT 0 - 44 IU/L '30   15  29     ' GGT 0 - 65 IU/L '26   18  26     ' Iron 38 - 169 ug/dL 175 High    165  143     Cholesterol, Total 100 - 199 mg/dL 178   178  191     Triglycerides 0 - 149 mg/dL 95   129  149     HDL >39 mg/dL 44   52  42     VLDL Cholesterol Cal 5 - 40 mg/dL '17   23  27     ' LDL Chol Calc (NIH) 0 - 99 mg/dL 117 High    103 High   122 High      Chol/HDL Ratio 0.0 - 5.0 ratio 4.0   3.4 CM  4.5 CM     Comment:                                   T. Chol/HDL Ratio                                              Men  Women                                1/2 Avg.Risk  3.4    3.3                                    Avg.Risk  5.0    4.4  2X Avg.Risk  9.6    7.1                                 3X Avg.Risk 23.4   11.0   Estimated CHD Risk 0.0 - 1.0 times avg. 0.8   0.5 CM  0.9 CM     Comment: The CHD Risk is based on the T. Chol/HDL ratio. Other  factors affect CHD Risk such as hypertension, smoking,  diabetes, severe obesity, and family history of  premature CHD.   TSH 0.450 - 4.500 uIU/mL 2.620   1.770  2.690     T4, Total 4.5 - 12.0 ug/dL 6.2   6.2  6.7     T3 Uptake Ratio 24 - 39 % '29   28  28     ' Free Thyroxine Index 1.2 - 4.9 1.8   1.7  1.9     Prostate Specific Ag, Serum 0.0 - 4.0 ng/mL 1.5  2.2 CM  1.5 CM  2.3 CM     Comment: Roche ECLIA methodology.  According to the American Urological Association, Serum PSA should  decrease and remain at undetectable levels after radical  prostatectomy. The AUA defines biochemical recurrence as an initial  PSA value 0.2 ng/mL or greater followed by a subsequent confirmatory  PSA value 0.2 ng/mL or greater.  Values obtained with different assay methods or kits cannot be used  interchangeably. Results cannot be interpreted as absolute evidence  of the presence or absence of malignant disease.   WBC 3.4 - 10.8 x10E3/uL 5.5   5.8  6.7   8.2 R   RBC 4.14 - 5.80 x10E6/uL 4.68   4.72  4.97   4.59 R   Hemoglobin 13.0 - 17.7 g/dL 14.9   14.8  16.2     Hematocrit 37.5 - 51.0 % 43.7   44.2  46.4     MCV 79 - 97 fL 93   94  93   92 R   MCH 26.6 - 33.0 pg 31.8   31.4  32.6   31.3 R   MCHC 31.5 - 35.7 g/dL 34.1   33.5  34.9   34.0 R   RDW 11.6 - 15.4 % 13.0   13.1  13.4   13.3 R   Platelets 150 - 450 x10E3/uL 206   167  197   201 R   Neutrophils Not Estab. % 56   56  58     Lymphs Not Estab. % 31   32  31     Monocytes Not Estab. % '10   9  8     ' Eos Not Estab. % '2   2  2     ' Basos Not Estab. % '1   1  1     ' Neutrophils Absolute 1.4 - 7.0 x10E3/uL 3.1   3.3  3.9     Lymphocytes Absolute 0.7 - 3.1 x10E3/uL 1.7   1.8  2.1     Monocytes Absolute 0.1 - 0.9  x10E3/uL 0.6   0.5  0.5     EOS (ABSOLUTE) 0.0 - 0.4 x10E3/uL 0.1   0.1  0.1     Basophils Absolute 0.0 - 0.2 x10E3/uL 0.0   0.0  0.0     Immature Granulocytes Not Estab. % 0   0  0     Immature Grans (  ____________________________________________  EKG  Normal sinus rhythm at 69 bpm ____________________________________________    ____________________________________________   INITIAL IMPRESSION / ASSESSMENT AND PLAN  As part of my medical decision making, I reviewed the following data within the [facility]      Discussed no acute findings on EKG and labs with patient.        ____________________________________________   FINAL CLINICAL IMPRESSION Well exam   ED Discharge Orders     None        Note:  This document was prepared using Dragon voice recognition software and may include unintentional dictation errors.

## 2022-04-13 NOTE — Progress Notes (Signed)
  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 Pt denies any issues or concerns at this time./CL,RMA

## 2022-05-03 ENCOUNTER — Ambulatory Visit: Payer: Self-pay

## 2022-05-03 DIAGNOSIS — Z0000 Encounter for general adult medical examination without abnormal findings: Secondary | ICD-10-CM

## 2022-05-03 NOTE — Progress Notes (Signed)
30 day hearing retest from 04/06/2022 annual hearing screen.  Potential STS still showing in left ear.  Referred to Dr. Odis Luster at [facility] for evaluation & determination of work relatedness.  AMD

## 2022-09-14 ENCOUNTER — Ambulatory Visit: Payer: 59 | Admitting: Urology

## 2022-10-18 ENCOUNTER — Ambulatory Visit (INDEPENDENT_AMBULATORY_CARE_PROVIDER_SITE_OTHER): Payer: 59 | Admitting: Urology

## 2022-10-18 ENCOUNTER — Encounter: Payer: Self-pay | Admitting: Urology

## 2022-10-18 VITALS — BP 129/86 | HR 103 | Ht 66.0 in | Wt 215.0 lb

## 2022-10-18 DIAGNOSIS — Z87438 Personal history of other diseases of male genital organs: Secondary | ICD-10-CM

## 2022-10-18 DIAGNOSIS — Z125 Encounter for screening for malignant neoplasm of prostate: Secondary | ICD-10-CM | POA: Diagnosis not present

## 2022-10-18 DIAGNOSIS — N529 Male erectile dysfunction, unspecified: Secondary | ICD-10-CM

## 2022-10-18 DIAGNOSIS — N401 Enlarged prostate with lower urinary tract symptoms: Secondary | ICD-10-CM

## 2022-10-18 LAB — BLADDER SCAN AMB NON-IMAGING

## 2022-10-18 MED ORDER — TADALAFIL 20 MG PO TABS: 20.0000 mg | ORAL_TABLET | ORAL | 3 refills | Status: DC | PRN

## 2022-10-18 NOTE — Patient Instructions (Signed)

## 2022-10-18 NOTE — Progress Notes (Signed)
   10/18/2022 1:46 PM   [place] [birth year] [id number]  Reason for visit: Follow up urinary symptoms, hx of prostatitis, PSA screening, ED  HPI: 52 year old male who I saw in mid January 2023 for 4 weeks of worsening urinary symptoms with urgency, frequency, feeling of incomplete emptying, weak stream.  He was unable to give a urinalysis and PVR was normal.  His symptoms were more consistent with prostatitis, and he was treated with a 10-day course of Bactrim as well as Flomax.  This completely resolved his symptoms and he denies any urinary complaints today and is doing very well.  He has been off the Flomax, but does take it occasionally if he notices some burning.  In terms of ED, Cialis 20 mg on demand continues to work well, this was refilled.  We reviewed the AUA guidelines regarding PSA screening, most recent PSA from January 2023 was normal at 2.2, and stable from 2021.  Guidelines reviewed regarding change of screening to every 2 to 4 years for patients with average risk.  Cialis refilled, can be filled by PCP moving forward Continue PSA screening every 2 to 4 years with PCP Follow-up with urology as needed   Billey Co, [place] [address] [place], [place] [address] [phone]

## 2022-10-24 ENCOUNTER — Ambulatory Visit
Admission: RE | Admit: 2022-10-24 | Discharge: 2022-10-24 | Disposition: A | Discharge status: Home / Self Care | Payer: 59 | Source: Ambulatory Visit | Attending: Physician Assistant | Admitting: Physician Assistant

## 2022-10-24 ENCOUNTER — Ambulatory Visit
Admission: RE | Admit: 2022-10-24 | Discharge: 2022-10-24 | Disposition: A | Discharge status: Home / Self Care | Payer: 59 | Attending: Physician Assistant | Admitting: Physician Assistant

## 2022-10-24 ENCOUNTER — Ambulatory Visit: Payer: Self-pay | Admitting: Physician Assistant

## 2022-10-24 VITALS — BP 129/78 | HR 78 | Temp 97.6°F | Resp 12 | Ht 69.0 in | Wt 215.0 lb

## 2022-10-24 DIAGNOSIS — M542 Cervicalgia: Secondary | ICD-10-CM

## 2022-10-24 NOTE — Addendum Note (Signed)
Addended by: Aliene Altes on: 10/24/2022 09:41 AM   Modules accepted: Orders

## 2022-10-24 NOTE — Progress Notes (Signed)
   Subjective: Chronic neck pain    Patient ID: Cody [name], male    DOB: 07-Feb-1971, 52 y.o.   MRN: [id number]  HPI Patient is difficult to neck pain approximately 1 year.  No provocative incident for complaint.  Patient state seen by chiropractor with no noticeable relief with manipulation.  States pain starts at the neck and rotates to the posterior right scapular area.  Pain wax and wane from a 3 to a 7.  Describes the pain as "achy". Review of Systems DDG 1 cervical spine, GERD, and sleep apnea.    Objective:   Physical Exam BP is 129/78, pulse 78, respiration 12, temperature 97.6, and patient is 96% O2 sat on room air.  Patient weighs 215 pounds and BMI is 31.75. No obvious deformity to cervical spine.  Patient has full and equal range of motion of the cervical spine.  No guarding with palpation of cervical spine.       Assessment & Plan: Neck pain   Patient sent to get an x-ray of the cervical spine.  Patient will be consulted to [facility] for physical therapy.

## 2022-10-24 NOTE — Progress Notes (Signed)
Pt presents today with neck pain for about a year right side of neck and radiates to base of skull.

## 2022-10-26 ENCOUNTER — Encounter: Payer: Self-pay | Admitting: Physician Assistant

## 2022-11-21 ENCOUNTER — Telehealth: Payer: Self-pay

## 2022-11-21 DIAGNOSIS — G4733 Obstructive sleep apnea (adult) (pediatric): Secondary | ICD-10-CM

## 2022-11-21 NOTE — Telephone Encounter (Signed)
Cody [name] called [facility] stating he was talking with [facility] about his CPAP machine.  He said they told him he can get a new machine every 5 years.  Said the machine he has is at least 52 years old.  Cody [name] said the [facility] representative told him that he needs to get an order for a CPAP machine from his PCP.  Order put in Epic for CPAP (machine). Faxed order to [phone] [facility].  AMD

## 2022-11-21 NOTE — Telephone Encounter (Signed)
Cody [name] called [facility] stating [facility] informed him he can get a new CPAP every 5 years.  He's had the same machine for 10 years.  [name] they told him to have our clinic fax them an order for a CPAP.  AMD

## 2022-11-21 NOTE — Telephone Encounter (Signed)
error 

## 2023-01-02 ENCOUNTER — Ambulatory Visit: Payer: 59 | Admitting: Adult Health

## 2023-01-02 VITALS — BP 141/87 | HR 78 | Temp 97.7°F | Resp 14 | Ht 69.0 in | Wt 215.0 lb

## 2023-01-02 DIAGNOSIS — G4733 Obstructive sleep apnea (adult) (pediatric): Secondary | ICD-10-CM

## 2023-01-02 NOTE — Progress Notes (Signed)
Needs face to face with provider for con't CPAP.  Stated has CPAP for 10 years and cannot get replacement without provider visit.  Stated compliance with CPAP use and effective.  Notes need faxed [phone] to [facility].

## 2023-01-02 NOTE — Progress Notes (Signed)
[facility] [address] [place], [place] [address]   Office Visit Note  Patient Name: Cody [name]  [id number]  [id number]  Date of Service: 01/02/2023  Chief Complaint  Patient presents with   Sleep Apnea     HPI Pt is here for face to face visit for OSA.  Cody [name] has been diagnosed with obstructive sleep apnea for the past 10 years and has been using a CPAP machine.  He still has his original machine and after 10 years now has noticed that the wires are frayed and he is concerned about its safe use. He uses his machine every night while sleeping for approximately 8-1/2 to 9 hours.  He does snore profusely if he does not wear the mask.  He is 100% compliant with its use at night.  When he was diagnosed with OSA he had headaches fatigue and intense snoring which is all been resolved with his machine. NO weight loss or gain since establishing cpap therapy.      Current Medication:  Outpatient Encounter Medications as of 01/02/2023  Medication Sig   tadalafil (CIALIS) 20 MG tablet Take 1 tablet (20 mg total) by mouth as needed for erectile dysfunction. 1 tab po daily prn ED, max is 1 tab per 24 hours   buPROPion (WELLBUTRIN SR) 150 MG 12 hr tablet Take 1 tablet (150 mg total) by mouth 2 (two) times daily. For the first 3 days take 1 tablet daily; then increase to 1 tablet twice a day. (Patient not taking: Reported on 10/24/2022)   diclofenac Sodium (VOLTAREN) 1 % GEL Apply 2 g topically 4 (four) times daily. (Patient not taking: Reported on 10/24/2022)   No facility-administered encounter medications on file as of 01/02/2023.      Medical History: Past Medical History:  Diagnosis Date   Knee pain, left    Palpitations    Sleep apnea with use of continuous positive airway pressure (CPAP)    Vertigo      Vital Signs: BP (!) 141/87   Pulse 78   Temp 97.7 F (36.5 C)   Resp 14   Ht 5\' 9"  (1.753 m)   Wt 215 lb (97.5 kg)   SpO2 96%   BMI 31.75 kg/m     Review of Systems  Constitutional:  Negative for chills, fatigue and fever.  Neurological:  Negative for dizziness, light-headedness and headaches.    Physical Exam Vitals and nursing note reviewed.  Constitutional:      Appearance: Normal appearance.  HENT:     Head: Normocephalic.     Mouth/Throat:     Mouth: Mucous membranes are moist.  Eyes:     Pupils: Pupils are equal, round, and reactive to light.  Cardiovascular:     Rate and Rhythm: Normal rate.  Pulmonary:     Effort: Pulmonary effort is normal.     Breath sounds: Normal breath sounds.  Lymphadenopathy:     Cervical: No cervical adenopathy.  Neurological:     Mental Status: He is alert.    Assessment/Plan: 1. OSA on CPAP PT would benefit from continued use of cpap machine.  He should have his machine replaced, for safety concerns at this time. Continue CPAP therapy as before.      General Counseling: Cody [name] verbalizes understanding of the findings of todays visit and agrees with plan of treatment. I have discussed any further diagnostic evaluation that may be needed or ordered today. We also reviewed his medications today. he  has been encouraged to call the office with any questions or concerns that should arise related to todays visit.   No orders of the defined types were placed in this encounter.   No orders of the defined types were placed in this encounter.   Time spent:15 Minutes    Johnna Acosta AGNP-C Nurse Practitioner

## 2023-01-31 ENCOUNTER — Encounter: Payer: Self-pay | Admitting: Physician Assistant

## 2023-01-31 ENCOUNTER — Ambulatory Visit: Payer: Self-pay | Admitting: Physician Assistant

## 2023-01-31 VITALS — BP 137/87 | HR 76 | Temp 98.1°F | Resp 16

## 2023-01-31 DIAGNOSIS — L918 Other hypertrophic disorders of the skin: Secondary | ICD-10-CM

## 2023-01-31 NOTE — Progress Notes (Signed)
   Subjective: Skin tag    Patient ID: Cody [name], male    DOB: 1971/06/06, 52 y.o.   MRN: [id number]  HPI Patient request removal of skin tag left lateral abdomen.  Patient state lesions rub against clothing causing him to manage mild bleeding.   Review of Systems GERD, hyperlipidemia, and sleep apnea.    Objective:   Physical Exam BP 137/87  Pulse 76  Resp 16  Temp 98.1 F (36.7 C)  SpO2 98 %   Smoking status Former  Passive exposure Past  Counseling given? Not Answered  Smokeless status Current  Uses Chew  No acute distress.  Skin tag as mentioned above left lateral abdomen.      Assessment & Plan:   Obtain patient consent for cryotherapy.  Patient will follow-up 1 week if no improvement or worsening complaint.

## 2023-01-31 NOTE — Progress Notes (Signed)
Here to show provider a "skin tag" on left side chest/flank reported, but not shown to nurse.  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

## 2023-04-05 ENCOUNTER — Ambulatory Visit: Payer: Self-pay

## 2023-04-05 DIAGNOSIS — Z0289 Encounter for other administrative examinations: Secondary | ICD-10-CM

## 2023-04-05 LAB — POCT URINALYSIS DIPSTICK
Bilirubin, UA: NEGATIVE
Blood, UA: POSITIVE
Glucose, UA: NEGATIVE
Ketones, UA: NEGATIVE
Leukocytes, UA: NEGATIVE
Nitrite, UA: NEGATIVE
Protein, UA: NEGATIVE
Spec Grav, UA: 1.025 (ref 1.010–1.025)
Urobilinogen, UA: 0.2 U/dL
pH, UA: 6 (ref 5.0–8.0)

## 2023-04-05 NOTE — Addendum Note (Signed)
Addended by: Gardner Candle on: 04/05/2023 02:01 PM   Modules accepted: Orders

## 2023-04-05 NOTE — Progress Notes (Signed)
Hearing Screen postponed until visit with Nona Dell, PA-C.  PFT not administered because Matt states he doesn't use a SCBA or other Respirator and no longer doesn't do fit testing for them.  AMD

## 2023-04-06 LAB — CMP12+LP+TP+TSH+6AC+PSA+CBC…
ALT: 30 IU/L (ref 0–44)
AST: 18 IU/L (ref 0–40)
Albumin: 4.4 g/dL (ref 3.8–4.9)
Alkaline Phosphatase: 99 IU/L (ref 44–121)
BUN/Creatinine Ratio: 13 (ref 9–20)
BUN: 15 mg/dL (ref 6–24)
Basophils Absolute: 0 10*3/uL (ref 0.0–0.2)
Basos: 1 %
Bilirubin Total: 0.4 mg/dL (ref 0.0–1.2)
Calcium: 9.8 mg/dL (ref 8.7–10.2)
Chloride: 103 mmol/L (ref 96–106)
Chol/HDL Ratio: 4.4 ratio (ref 0.0–5.0)
Cholesterol, Total: 185 mg/dL (ref 100–199)
Creatinine, Ser: 1.13 mg/dL (ref 0.76–1.27)
EOS (ABSOLUTE): 0.1 10*3/uL (ref 0.0–0.4)
Eos: 2 %
Estimated CHD Risk: 0.9 times avg. (ref 0.0–1.0)
Free Thyroxine Index: 2 (ref 1.2–4.9)
GGT: 27 IU/L (ref 0–65)
Globulin, Total: 2.9 g/dL (ref 1.5–4.5)
Glucose: 108 mg/dL — ABNORMAL HIGH (ref 70–99)
HDL: 42 mg/dL (ref >39)
Hematocrit: 45.4 % (ref 37.5–51.0)
Hemoglobin: 15.5 g/dL (ref 13.0–17.7)
Immature Grans (Abs): 0 10*3/uL (ref 0.0–0.1)
Immature Granulocytes: 0 %
Iron: 147 ug/dL (ref 38–169)
LDH: 138 IU/L (ref 121–224)
LDL Chol Calc (NIH): 120 mg/dL — ABNORMAL HIGH (ref 0–99)
Lymphocytes Absolute: 1.8 10*3/uL (ref 0.7–3.1)
Lymphs: 29 %
MCH: 31.5 pg (ref 26.6–33.0)
MCHC: 34.1 g/dL (ref 31.5–35.7)
MCV: 92 fL (ref 79–97)
Monocytes Absolute: 0.6 10*3/uL (ref 0.1–0.9)
Monocytes: 9 %
Neutrophils Absolute: 3.8 10*3/uL (ref 1.4–7.0)
Neutrophils: 59 %
Phosphorus: 2.5 mg/dL — ABNORMAL LOW (ref 2.8–4.1)
Platelets: 224 10*3/uL (ref 150–450)
Potassium: 4.5 mmol/L (ref 3.5–5.2)
Prostate Specific Ag, Serum: 3 ng/mL (ref 0.0–4.0)
RBC: 4.92 x10E6/uL (ref 4.14–5.80)
RDW: 12.6 % (ref 11.6–15.4)
Sodium: 139 mmol/L (ref 134–144)
T3 Uptake Ratio: 29 % (ref 24–39)
T4, Total: 7 ug/dL (ref 4.5–12.0)
TSH: 1.88 u[IU]/mL (ref 0.450–4.500)
Total Protein: 7.3 g/dL (ref 6.0–8.5)
Triglycerides: 126 mg/dL (ref 0–149)
Uric Acid: 6.4 mg/dL (ref 3.8–8.4)
VLDL Cholesterol Cal: 23 mg/dL (ref 5–40)
WBC: 6.3 10*3/uL (ref 3.4–10.8)
eGFR: 79 mL/min/{1.73_m2} (ref >59)

## 2023-04-11 ENCOUNTER — Ambulatory Visit: Payer: Self-pay | Admitting: Physician Assistant

## 2023-04-11 ENCOUNTER — Encounter: Payer: Self-pay | Admitting: Physician Assistant

## 2023-04-11 VITALS — BP 124/82 | HR 72 | Temp 97.8°F | Resp 16 | Ht 69.0 in | Wt 226.0 lb

## 2023-04-11 DIAGNOSIS — Z0289 Encounter for other administrative examinations: Secondary | ICD-10-CM

## 2023-04-11 DIAGNOSIS — R319 Hematuria, unspecified: Secondary | ICD-10-CM

## 2023-04-11 NOTE — Progress Notes (Signed)
Here for yearly physical with [facility].  Denies any complaints.  Stated CPAP is used as ordered and effective as he is compliant with usage and requests to keep using as ordered.  Fax verification of CPAP need requested to be sent to the CPAP provider.

## 2023-04-11 NOTE — Progress Notes (Signed)
[facility] ____________________________________________   None    (approximate)  I have reviewed the triage vital signs and the nursing notes.   HISTORY  Chief Complaint No chief complaint on file.   HPI Cody [name] is a 52 y.o. male patient presents for annual physical exam.  Patient also having a review for CPAP compliance after starting new machine.  Patient denies any discomfort or complaints with new CPAP machine at this time.  Patient also has a history of intermittent hematuria.         Past Medical History:  Diagnosis Date   Knee pain, left    Palpitations    Sleep apnea with use of continuous positive airway pressure (CPAP)    Vertigo     Patient Active Problem List   Diagnosis Date Noted   History of diverticulitis 06/24/2021   Pancreatitis 06/24/2021   Smokeless tobacco use 06/24/2021   Sensorineural hearing loss (SNHL) of both ears 11/05/2019   Abnormal posture 07/15/2019   Brachial radiculitis 07/15/2019   DDD (degenerative disc disease), cervical 07/15/2019   Muscle weakness 07/15/2019   Neck pain 07/15/2019   Tobacco dependence 04/01/2019   Elevated BP without diagnosis of hypertension 04/01/2019   Class 1 obesity due to excess calories without serious comorbidity with body mass index (BMI) of 31.0 to 31.9 in adult 04/01/2019   Sprain of ankle 04/02/2018   Sleep apnea 02/27/2014   GERD (gastroesophageal reflux disease) 02/27/2014   Hyperlipidemia 02/27/2014    Past Surgical History:  Procedure Laterality Date   INGUINAL HERNIA REPAIR Bilateral    NASAL SEPTUM SURGERY      Prior to Admission medications   Medication Sig Start Date End Date Taking? Authorizing Provider  buPROPion (WELLBUTRIN SR) 150 MG 12 hr tablet Take 1 tablet (150 mg total) by mouth 2 (two) times daily. For the first 3 days take 1 tablet daily; then increase to 1 tablet twice a day. Patient not taking: Reported on 10/24/2022 04/19/21    Joni Reining, PA-C  diclofenac Sodium (VOLTAREN) 1 % GEL Apply 2 g topically 4 (four) times daily. Patient not taking: Reported on 10/24/2022 07/30/20   Joni Reining, PA-C  tadalafil (CIALIS) 20 MG tablet Take 1 tablet (20 mg total) by mouth as needed for erectile dysfunction. 1 tab po daily prn ED, max is 1 tab per 24 hours 10/18/22   Sondra Come, MD    Allergies Amoxicillin-pot clavulanate, Levofloxacin, Nsaids, and Penicillins  Family History  Problem Relation Age of Onset   Heart disease Father    COPD Father    Lung cancer Father    Bladder Cancer Brother    Diabetes Maternal Grandmother     Social History Social History   Tobacco Use   Smoking status: Former    Passive exposure: Past   Smokeless tobacco: Current    Types: Chew  Substance Use Topics   Alcohol use: Yes    Comment: socially   Drug use: Never    Review of Systems Constitutional: No fever/chills Eyes: No visual changes. ENT: No sore throat. Cardiovascular: Denies chest pain. Respiratory: Denies shortness of breath. Gastrointestinal: No abdominal pain.  No nausea, no vomiting.  No diarrhea.  No constipation. Genitourinary: Negative for dysuria. Musculoskeletal: Negative for back pain. Skin: Negative for rash. Neurological: Negative for headaches, focal weakness or numbness.  ____________________________________________   PHYSICAL EXAM:  VITAL SIGNS: BP 124/82  Pulse 72  Resp 16  Temp 97.8  F (36.6 C)  SpO2 99 %  Weight 226 lb (102.5 kg)  Height 5\' 9"  (1.753 m)   BMI 33.37 kg/m2  BSA 2.23 m2   Constitutional: Alert and oriented. Well appearing and in no acute distress. Eyes: Conjunctivae are normal. PERRL. EOMI. Head: Atraumatic. Nose: No congestion/rhinnorhea. Mouth/Throat: Mucous membranes are moist.  Oropharynx non-erythematous. Neck: No stridor.  No cervical spine tenderness to palpation. Hematological/Lymphatic/Immunilogical: No cervical lymphadenopathy. Cardiovascular:  Normal rate, regular rhythm. Grossly normal heart sounds.  Good peripheral circulation. Respiratory: Normal respiratory effort.  No retractions. Lungs CTAB. Gastrointestinal: Soft and nontender. No distention. No abdominal bruits. No CVA tenderness. Genitourinary: Deferred Musculoskeletal: No lower extremity tenderness nor edema.  No joint effusions. Neurologic:  Normal speech and language. No gross focal neurologic deficits are appreciated. No gait instability. Skin:  Skin is warm, dry and intact. No rash noted. Psychiatric: Mood and affect are normal. Speech and behavior are normal.  ____________________________________________   LABS           Component Ref Range & Units 6 d ago (04/05/23) 1 yr ago (04/06/22) 1 yr ago (06/23/21) 2 yr ago (03/22/21) 2 yr ago (04/28/20) 3 yr ago (12/24/19) 3 yr ago (05/13/19)  Color, UA Dark Yellow Orange  yellow Dark Yellow Yellow Dark yellow  Clarity, UA Clear Clear  clear Clear Clear clear  Glucose, UA Negative Negative Negative  Negative Negative Negative Negative  Bilirubin, UA Negative Negative  negative Negative Negative neg  Ketones, UA Negative Negative  negative Negative Negative neg  Spec Grav, UA 1.010 - 1.025 1.025 >=1.030 Abnormal   >=1.030 Abnormal  >=1.030 Abnormal  1.020 >=1.030 Abnormal   Blood, UA Positive Positive CM  TRACE CM Negative Positive CM Positive CM  Comment: 1+  pH, UA 5.0 - 8.0 6.0 6.0  5.5 5.5 6.0 5.5  Protein, UA Negative Negative Negative  Positive Abnormal  CM Negative Negative Positive Abnormal  CM  Urobilinogen, UA 0.2 or 1.0 E.U./dL 0.2 0.2  0.2 0.2 0.2 0.2  Nitrite, UA Negative Negative  negative Negative Negative neg  Leukocytes, UA Negative Negative Negative  Negative Negative Negative Negative  Appearance   CLEAR R dark     Odor         Resulting Agency   [facility]             Specimen Collected: 04/05/23 13:44 Last Resulted: 04/05/23 13:44      Lab Flowsheet      Order Details      View  Encounter      Lab and Collection Details      Routing      Result History    View All Conversations on this Encounter      CM=Additional comments  R=Reference range differs from displayed range      Result Care Coordination   Patient Communication   Add Comments   Seen Back to Top    Other Results from 04/05/2023   Contains abnormal data CMP12+LP+TP+TSH+6AC+PSA+CBC. Order: [id number] Status: Final result      Visible to patient: Yes (seen)      Next appt: None      Dx: Encounter for physical examination re...    0 Result Notes            Component Ref Range & Units 6 d ago (04/05/23) 1 yr ago (04/06/22) 1 yr ago (08/10/21) 2 yr ago (03/22/21) 2 yr ago (04/28/20) 9 yr ago (04/22/13) 9 yr ago (04/22/13)  Glucose 70 - 99 mg/dL 132 High  97  440 High  R 127 High  R 137 High  R   Uric Acid 3.8 - 8.4 mg/dL 6.4 7.4 CM  6.7 CM 8.1 CM    Comment:            Therapeutic target for gout patients: <6.0  BUN 6 - 24 mg/dL 15 13  16 15 14  R   Creatinine, Ser 0.76 - 1.27 mg/dL 1.02 7.25  3.66 4.40 High  1.25 R   eGFR >59 mL/min/1.73 79 75  80     BUN/Creatinine Ratio 9 - 20 13 11  14 11     Sodium 134 - 144 mmol/L 139 139  140 140 138 R   Potassium 3.5 - 5.2 mmol/L 4.5 4.1  4.0 3.9 3.7 R   Chloride 96 - 106 mmol/L 103 102  102 103 105 R   Calcium 8.7 - 10.2 mg/dL 9.8 9.1  9.3 9.2 9.0 R   Phosphorus 2.8 - 4.1 mg/dL 2.5 Low  2.4 Low   2.5 Low  3.0    Total Protein 6.0 - 8.5 g/dL 7.3 6.8  6.5 7.2    Albumin 3.8 - 4.9 g/dL 4.4 4.2 R  4.4 R 4.4 R    Globulin, Total 1.5 - 4.5 g/dL 2.9 2.6  2.1 2.8    Bilirubin Total 0.0 - 1.2 mg/dL 0.4 0.7  0.5 0.6    Alkaline Phosphatase 44 - 121 IU/L 99 85  83 91 CM    LDH 121 - 224 IU/L 138 149  136 145    AST 0 - 40 IU/L 18 14  14 19     ALT 0 - 44 IU/L 30 30  15 29     GGT 0 - 65 IU/L 27 26  18 26     Iron 38 - 169 ug/dL 347 425 High   956 387    Cholesterol, Total 100 - 199 mg/dL 564 332  951 884    Triglycerides 0 -  149 mg/dL 166 95  063 016    HDL >39 mg/dL 42 44  52 42    VLDL Cholesterol Cal 5 - 40 mg/dL 23 17  23 27     LDL Chol Calc (NIH) 0 - 99 mg/dL 010 High  932 High   355 High  122 High     Chol/HDL Ratio 0.0 - 5.0 ratio 4.4 4.0 CM  3.4 CM 4.5 CM    Comment:                                   T. Chol/HDL Ratio                                             Men  Women                               1/2 Avg.Risk  3.4    3.3                                   Avg.Risk  5.0    4.4  2X Avg.Risk  9.6    7.1                                3X Avg.Risk 23.4   11.0  Estimated CHD Risk 0.0 - 1.0 times avg. 0.9 0.8 CM  0.5 CM 0.9 CM    Comment: The CHD Risk is based on the T. Chol/HDL ratio. Other factors affect CHD Risk such as hypertension, smoking, diabetes, severe obesity, and family history of premature CHD.  TSH 0.450 - 4.500 uIU/mL 1.880 2.620  1.770 2.690    T4, Total 4.5 - 12.0 ug/dL 7.0 6.2  6.2 6.7    T3 Uptake Ratio 24 - 39 % 29 29  28 28     Free Thyroxine Index 1.2 - 4.9 2.0 1.8  1.7 1.9    Prostate Specific Ag, Serum 0.0 - 4.0 ng/mL 3.0 1.5 CM 2.2 CM 1.5 CM 2.3 CM    Comment: Roche ECLIA methodology. According to the American Urological Association, Serum PSA should decrease and remain at undetectable levels after radical prostatectomy. The AUA defines biochemical recurrence as an initial PSA value 0.2 ng/mL or greater followed by a subsequent confirmatory PSA value 0.2 ng/mL or greater. Values obtained with different assay methods or kits cannot be used interchangeably. Results cannot be interpreted as absolute evidence of the presence or absence of malignant disease.  WBC 3.4 - 10.8 x10E3/uL 6.3 5.5  5.8 6.7  8.2 R  RBC 4.14 - 5.80 x10E6/uL 4.92 4.68  4.72 4.97  4.59 R  Hemoglobin 13.0 - 17.7 g/dL 16.1 09.6  04.5 40.9    Hematocrit 37.5 - 51.0 % 45.4 43.7  44.2 46.4    MCV 79 - 97 fL 92 93  94 93  92 R  MCH 26.6 - 33.0 pg 31.5 31.8  31.4 32.6   31.3 R  MCHC 31.5 - 35.7 g/dL 81.1 91.4  78.2 95.6  21.3 R  RDW 11.6 - 15.4 % 12.6 13.0  13.1 13.4  13.3 R  Platelets 150 - 450 x10E3/uL 224 206  167 197  201 R  Neutrophils Not Estab. % 59 56  56 58    Lymphs Not Estab. % 29 31  32 31    Monocytes Not Estab. % 9 10  9 8     Eos Not Estab. % 2 2  2 2     Basos Not Estab. % 1 1  1 1     Neutrophils Absolute 1.4 - 7.0 x10E3/uL 3.8 3.1  3.3 3.9    Lymphocytes Absolute 0.7 - 3.1 x10E3/uL 1.8 1.7  1.8 2.1    Monocytes Absolute 0.1 - 0.9 x10E3/uL 0.6 0.6  0.5 0.5    EOS (ABSOLUTE) 0.0 - 0.4 x10E3/uL 0.1 0.1  0.1 0.1    Basophils Absolute 0.0 - 0.2 x10E3/uL 0.0 0.0  0.0 0.0    Immature Granulocytes Not Estab. % 0 0  0 0    Immature Grans (Abs)              ____________________________________________  EKG  Sinus rhythm at 68 bpm ____________________________________________    ____________________________________________   INITIAL IMPRESSION / ASSESSMENT AND PLAN As part of my medical decision making, I reviewed the following data within the electronic MEDICAL RECORD NUMBER    No acute findings on physical exam or EKG.  Patient labs reveal increased PSA but patient is followed by urologist.  There is also a pattern of  intermitting hematuria.  Will repeat UA and consider consult to nephrology.  Patient is asymptomatic.  Patient also recently started new CPAP machine and states compliance with usage and monitoring on his phone.         ____________________________________________   FINAL CLINICAL IMPRESSION Well exam   ED Discharge Orders          Ordered    Vision screening        04/11/23 0926    Hearing screening        04/11/23 0926             Note:  This document was prepared using Dragon voice recognition software and may include unintentional dictation errors.

## 2023-04-12 LAB — POCT URINALYSIS DIPSTICK
Bilirubin, UA: NEGATIVE
Blood, UA: POSITIVE
Glucose, UA: NEGATIVE
Ketones, UA: NEGATIVE
Leukocytes, UA: NEGATIVE
Nitrite, UA: NEGATIVE
Protein, UA: NEGATIVE
Spec Grav, UA: 1.025 (ref 1.010–1.025)
Urobilinogen, UA: 0.2 E.U./dL
pH, UA: 6 (ref 5.0–8.0)

## 2023-04-12 NOTE — Addendum Note (Signed)
Addended by: Gardner Candle on: 04/12/2023 10:04 AM   Modules accepted: Orders

## 2023-04-12 NOTE — Addendum Note (Signed)
Addended by: Charm Barges M on: 04/12/2023 10:00 AM   Modules accepted: Orders

## 2023-04-13 NOTE — Progress Notes (Signed)
Office notes faxed to [phone] to [facility] for CPAP compliance.  AMD

## 2023-04-17 NOTE — Progress Notes (Unsigned)
04/18/2023 9:45 AM   Cody [name] [place] Scrape 05/08/1971 [id number]  Referring provider: No referring provider defined for this encounter.  Urological history: 1. BPH with LU TS -PSA (03/2023) 3.0 -prostate volume (2021) 29 grams  2. Family history of bladder cancer -brother   3, erectile dysfunction -Contributing factors of age, BPH, sleep apnea, degenerative joint disease, smoking, hypertension, obesity, hyperlipidemia and alcohol consumption -Tadalafil 20 mg on-demand dosing  Chief Complaint  Patient presents with   Follow-up   HPI: Cody [name] is a 52 y.o. male who presents today for increase in PSA and blood in urine.    Previous records reviewed.   I PSS 16/2  UA yellow clear, trace glucose, specific gravity greater than 1.030, 1+ blood, pH 5.5, 0-5 WBCs, 3-10 RBCs, 0-10 epithelial cells, mucus threads present and moderate bacteria present  PVR 0 mL   He has been having some slight dysuria, but he states this has been occurring for several months.  It is relieved when he takes the tamsulosin.   Patient denies any modifying or aggravating factors.  Patient denies any recent UTI's, gross hematuria or suprapubic/flank pain.  Patient denies any fevers, chills, nausea or vomiting.     IPSS     Row Name 04/18/23 0900         International Prostate Symptom Score   How often have you had the sensation of not emptying your bladder? Less than 1 in 5     How often have you had to urinate less than every two hours? More than half the time     How often have you found you stopped and started again several times when you urinated? More than half the time     How often have you found it difficult to postpone urination? Less than 1 in 5 times     How often have you had a weak urinary stream? More than half the time     How often have you had to strain to start urination? Less than 1 in 5 times     How many times did you typically get up at night to urinate? 1 Time     Total  IPSS Score 16       Quality of Life due to urinary symptoms   If you were to spend the rest of your life with your urinary condition just the way it is now how would you feel about that? Mostly Satisfied              Score:  1-7 Mild 8-19 Moderate 20-35 Severe    PMH: Past Medical History:  Diagnosis Date   Knee pain, left    Palpitations    Sleep apnea with use of continuous positive airway pressure (CPAP)    Vertigo     Surgical History: Past Surgical History:  Procedure Laterality Date   INGUINAL HERNIA REPAIR Bilateral    NASAL SEPTUM SURGERY      Home Medications:  Allergies as of 04/18/2023       Reactions   Amoxicillin-pot Clavulanate    Levofloxacin Other (See Comments)   Nsaids    ENT recommends avoiding NSAIDS due to sensorineural hearing loss   Penicillins Other (See Comments)        Medication List        Accurate as of April 18, 2023  9:45 AM. If you have any questions, ask your nurse or doctor.  STOP taking these medications    buPROPion 150 MG 12 hr tablet Commonly known as: Wellbutrin SR   diclofenac Sodium 1 % Gel Commonly known as: Voltaren       TAKE these medications    tadalafil 20 MG tablet Commonly known as: CIALIS Take 1 tablet (20 mg total) by mouth as needed for erectile dysfunction. 1 tab po daily prn ED, max is 1 tab per 24 hours   tamsulosin 0.4 MG Caps capsule Commonly known as: FLOMAX Take 1 capsule (0.4 mg total) by mouth daily.        Allergies:  Allergies  Allergen Reactions   Amoxicillin-Pot Clavulanate    Levofloxacin Other (See Comments)   Nsaids     ENT recommends avoiding NSAIDS due to sensorineural hearing loss   Penicillins Other (See Comments)    Family History: Family History  Problem Relation Age of Onset   Heart disease Father    COPD Father    Lung cancer Father    Bladder Cancer Brother    Diabetes Maternal Grandmother     Social History:  reports that he has  quit smoking. He has been exposed to tobacco smoke. His smokeless tobacco use includes chew. He reports current alcohol use. He reports that he does not use drugs.  ROS: Pertinent ROS in HPI  Physical Exam: BP (!) 131/90   Pulse 86   Ht 5\' 9"  (1.753 m)   Wt 220 lb (99.8 kg)   BMI 32.49 kg/m   Constitutional:  Well nourished. Alert and oriented, No acute distress. HEENT: [place] AT, moist mucus membranes.  Trachea midline Cardiovascular: No clubbing, cyanosis, or edema. Respiratory: Normal respiratory effort, no increased work of breathing. GU: No CVA tenderness.  No bladder fullness or masses.  Patient with uncircumcised phallus. Foreskin easily retracted  Urethral meatus is patent.  No penile discharge. No penile lesions or rashes. Scrotum without lesions, cysts, rashes and/or edema.  Testicles are located scrotally bilaterally. No masses are appreciated in the testicles. Left and right epididymis are normal. Rectal: Patient with  normal sphincter tone. Anus and perineum without scarring or rashes. No rectal masses are appreciated. Prostate is approximately 45 grams, no nodules are appreciated. Seminal vesicles could not be palpated Neurologic: Grossly intact, no focal deficits, moving all 4 extremities. Psychiatric: Normal mood and affect.  Laboratory Data: Lab Results  Component Value Date   WBC 6.3 04/05/2023   HGB 15.5 04/05/2023   HCT 45.4 04/05/2023   MCV 92 04/05/2023   PLT 224 04/05/2023    Lab Results  Component Value Date   CREATININE 1.13 04/05/2023    Lab Results  Component Value Date   TSH 1.880 04/05/2023       Component Value Date/Time   CHOL 185 04/05/2023 0845   HDL 42 04/05/2023 0845   CHOLHDL 4.4 04/05/2023 0845   LDLCALC 120 (H) 04/05/2023 0845    Lab Results  Component Value Date   AST 18 04/05/2023   Lab Results  Component Value Date   ALT 30 04/05/2023    Urinalysis See epic and hpi I have reviewed the labs.   Pertinent Imaging:   04/18/23 09:26  Scan Result 0 ml    Assessment & Plan:    1. Increase in PSA -UA w/ heme and bacteruria  -urine culture -urine atypical's sent -DRE benign  -will repeat PSA (free and total) in two months  2. Microscopic hematuria -Ua w/ micro heme -cultures pending -if found to have infection,  will treat ant then recheck UA in two months with his PSA -If negative, will pursue hematuria work up RUS and cysto   3. BPH with LU TS -continue tamsulosin 0.4 mg daily  Return for pending cultures .  These notes generated with voice recognition software. I apologize for typographical errors.  Cloretta Ned  [facility] [address]  [address] [place], [place] [address] [phone]

## 2023-04-18 ENCOUNTER — Ambulatory Visit: Payer: 59 | Admitting: Urology

## 2023-04-18 ENCOUNTER — Encounter: Payer: Self-pay | Admitting: Urology

## 2023-04-18 VITALS — BP 131/90 | HR 86 | Ht 69.0 in | Wt 220.0 lb

## 2023-04-18 DIAGNOSIS — N401 Enlarged prostate with lower urinary tract symptoms: Secondary | ICD-10-CM | POA: Diagnosis not present

## 2023-04-18 DIAGNOSIS — R972 Elevated prostate specific antigen [PSA]: Secondary | ICD-10-CM

## 2023-04-18 DIAGNOSIS — N138 Other obstructive and reflux uropathy: Secondary | ICD-10-CM

## 2023-04-18 DIAGNOSIS — R3129 Other microscopic hematuria: Secondary | ICD-10-CM | POA: Diagnosis not present

## 2023-04-18 LAB — URINALYSIS, COMPLETE
Bilirubin, UA: NEGATIVE
Ketones, UA: NEGATIVE
Leukocytes,UA: NEGATIVE
Nitrite, UA: NEGATIVE
Protein,UA: NEGATIVE
Specific Gravity, UA: >1.03 — ABNORMAL HIGH (ref 1.005–1.030)
Urobilinogen, Ur: 0.2 mg/dL (ref 0.2–1.0)
pH, UA: 5.5 (ref 5.0–7.5)

## 2023-04-18 LAB — BLADDER SCAN AMB NON-IMAGING: Scan Result: 0

## 2023-04-18 LAB — MICROSCOPIC EXAMINATION

## 2023-04-18 MED ORDER — TAMSULOSIN HCL 0.4 MG PO CAPS
0.4000 mg | ORAL_CAPSULE | Freq: Every day | ORAL | 3 refills | Status: DC
Start: 2023-04-18 — End: 2024-05-20

## 2023-04-21 LAB — CULTURE, URINE COMPREHENSIVE

## 2023-04-22 ENCOUNTER — Other Ambulatory Visit: Payer: Self-pay | Admitting: Urology

## 2023-04-22 DIAGNOSIS — R3129 Other microscopic hematuria: Secondary | ICD-10-CM

## 2023-04-26 LAB — MYCOPLASMA / UREAPLASMA CULTURE
Mycoplasma hominis Culture: NEGATIVE
Ureaplasma urealyticum: NEGATIVE

## 2023-04-30 ENCOUNTER — Ambulatory Visit
Admission: RE | Admit: 2023-04-30 | Discharge: 2023-04-30 | Disposition: A | Discharge status: Home / Self Care | Payer: 59 | Source: Ambulatory Visit | Attending: Urology | Admitting: Urology

## 2023-04-30 DIAGNOSIS — R3129 Other microscopic hematuria: Secondary | ICD-10-CM | POA: Diagnosis present

## 2023-05-08 ENCOUNTER — Other Ambulatory Visit: Payer: 59 | Admitting: Urology

## 2023-05-16 ENCOUNTER — Ambulatory Visit (INDEPENDENT_AMBULATORY_CARE_PROVIDER_SITE_OTHER): Payer: 59 | Admitting: Urology

## 2023-05-16 ENCOUNTER — Encounter: Payer: Self-pay | Admitting: Urology

## 2023-05-16 VITALS — BP 149/91 | HR 80 | Ht 69.0 in | Wt 220.0 lb

## 2023-05-16 DIAGNOSIS — R3129 Other microscopic hematuria: Secondary | ICD-10-CM

## 2023-05-16 MED ORDER — LIDOCAINE HCL URETHRAL/MUCOSAL 2 % EX GEL
1.0000 | Freq: Once | CUTANEOUS | Status: AC
Start: 2023-05-16 — End: 2023-05-16
  Administered 2023-05-16: 1 via URETHRAL

## 2023-05-16 MED ORDER — SULFAMETHOXAZOLE-TRIMETHOPRIM 800-160 MG PO TABS
1.0000 | ORAL_TABLET | Freq: Once | ORAL | Status: AC
Start: 2023-05-16 — End: 2023-05-16
  Administered 2023-05-16: 1 via ORAL

## 2023-05-16 NOTE — Progress Notes (Signed)
Cystoscopy Procedure Note:  Indication: Microscopic hematuria  After informed consent and discussion of the procedure and its risks, Cody [name] was positioned and prepped in the standard fashion. Cystoscopy was performed with a flexible cystoscope. The urethra, bladder neck and entire bladder was visualized in a standard fashion. The prostate was small to moderate in size. The ureteral orifices were visualized in their normal location and orientation.  Bladder mucosa grossly normal throughout, no abnormalities on retroflexion  Imaging: Renal ultrasound benign  Findings: Normal cystoscopy  Assessment and Plan: Negative microscopic hematuria workup RTC 1 year PVR  Legrand Rams, MD 05/16/2023

## 2023-06-19 ENCOUNTER — Other Ambulatory Visit: Payer: Self-pay

## 2023-06-19 NOTE — Progress Notes (Signed)
Pt completed random UDS and ETOH.  UDS Pending with [facility].  ETOH CLEARED.

## 2023-06-27 ENCOUNTER — Other Ambulatory Visit: Payer: 59

## 2023-06-27 DIAGNOSIS — Z1152 Encounter for screening for COVID-19: Secondary | ICD-10-CM

## 2023-06-27 DIAGNOSIS — R6889 Other general symptoms and signs: Secondary | ICD-10-CM

## 2023-06-27 LAB — POCT INFLUENZA A/B
Influenza A, POC: NEGATIVE
Influenza B, POC: NEGATIVE

## 2023-06-27 LAB — POC COVID19 BINAXNOW: SARS Coronavirus 2 Ag: NEGATIVE

## 2023-06-27 NOTE — Progress Notes (Signed)
this morning, fatigue, sinus pressure, runny nose/sore throat congestion.

## 2023-08-23 ENCOUNTER — Ambulatory Visit
Admission: RE | Admit: 2023-08-23 | Discharge: 2023-08-23 | Disposition: A | Discharge status: Home / Self Care | Payer: 59 | Attending: Student | Admitting: Student

## 2023-08-23 ENCOUNTER — Ambulatory Visit: Payer: Self-pay | Admitting: Student

## 2023-08-23 ENCOUNTER — Ambulatory Visit
Admission: RE | Admit: 2023-08-23 | Discharge: 2023-08-23 | Disposition: A | Discharge status: Home / Self Care | Payer: 59 | Source: Ambulatory Visit | Attending: Student | Admitting: Student

## 2023-08-23 ENCOUNTER — Encounter: Payer: Self-pay | Admitting: Student

## 2023-08-23 VITALS — BP 137/97 | HR 96 | Temp 98.7°F | Resp 16

## 2023-08-23 DIAGNOSIS — R051 Acute cough: Secondary | ICD-10-CM | POA: Diagnosis present

## 2023-08-23 DIAGNOSIS — J111 Influenza due to unidentified influenza virus with other respiratory manifestations: Secondary | ICD-10-CM

## 2023-08-23 MED ORDER — AZITHROMYCIN 250 MG PO TABS
ORAL_TABLET | ORAL | 0 refills | Status: DC
Start: 2023-08-23 — End: 2023-08-28

## 2023-08-23 NOTE — Progress Notes (Signed)
Reports testing positive for influenza A 3 days ago after s/s URI started 4 days ago and arrives in mask.  Stated expiratory adventitious breath sounds audible as shown on video from yesterday.  Arrives today afebrile reporting no fever x 24 hrs, but has been taking fever reducing meds.  No meds taken this day except Mucinex DM reportedly.  Denies congestion and stated cough is dry and not bad at night.  Mild dyspnea with increased exertion but was able to walk the stairs into clinic today.  Taking PO well reported.  Upon deep breath coughs with exhalation.

## 2023-08-23 NOTE — Progress Notes (Signed)
   Acute Office Visit  Subjective:     Patient ID: Cody [name], male    DOB: August 20, 1970, 53 y.o.   MRN: [id number]  No chief complaint on file.   HPI Patient is in today for complaints.  Patient tested positive for the flu this week.  He did have a fever which has resolved.  Denies any GI complaints.  He has been taking Mucinex and yesterday was experiencing abnormal lung sounds with full exhalation.        Objective:    BP (!) 137/97   Pulse 96   Temp 98.7 F (37.1 C)   Resp 16   SpO2 95%    Physical Exam Constitutional:      General: He is not in acute distress.    Appearance: Normal appearance. He is not toxic-appearing.  HENT:     Right Ear: Tympanic membrane, ear canal and external ear normal. There is no impacted cerumen.     Left Ear: Tympanic membrane, ear canal and external ear normal. There is no impacted cerumen.     Nose: Nose normal.     Mouth/Throat:     Mouth: Mucous membranes are moist.     Pharynx: Oropharynx is clear. No oropharyngeal exudate.  Eyes:     Conjunctiva/sclera: Conjunctivae normal.  Cardiovascular:     Rate and Rhythm: Normal rate and regular rhythm.     Heart sounds: Normal heart sounds.  Pulmonary:     Breath sounds: Normal breath sounds.  Musculoskeletal:     Cervical back: Neck supple. No tenderness.  Skin:    General: Skin is warm and dry.  Neurological:     Mental Status: He is alert.     No results found for any visits on 08/23/23.      Assessment & Plan:   Presents status post positive flu test from earlier this week.  Symptoms were mostly upper respiratory but has concerns for abnormal lung sounds since yesterday.  Fever has resolved.  Nonproductive cough present.  No GI complaints.  Denies shortness of breath or trouble breathing.  Denies chest pain.  On exam, lung sounds were clear and audible bilaterally.  Recommended chest x-ray, Mucinex/Sudafed, Z-Pak.  Will contact patient if any abnormalities found on chest  x-ray.  Advised to continue to monitor.  If symptoms worsen present to clinic for reevaluation.  Patient expressed understanding and agreement of plan.  Problem List Items Addressed This Visit   None Visit Diagnoses       Acute cough    -  Primary   Relevant Orders   DG Chest 2 View     Influenza with respiratory manifestation       Relevant Medications   azithromycin (ZITHROMAX) 250 MG tablet       Meds ordered this encounter  Medications   azithromycin (ZITHROMAX) 250 MG tablet    Sig: Take 2 tablets on day 1, then 1 tablet daily on days 2 through 5    Dispense:  6 tablet    Refill:  0    Supervising Provider:   Erasmo Downer [[id number]]    No follow-ups on file.  Ivar Drape, PA-C

## 2023-08-28 ENCOUNTER — Encounter: Payer: Self-pay | Admitting: Physician Assistant

## 2023-08-28 ENCOUNTER — Ambulatory Visit: Payer: Self-pay | Admitting: Physician Assistant

## 2023-08-28 VITALS — BP 138/96 | HR 99 | Temp 98.1°F | Resp 14

## 2023-08-28 DIAGNOSIS — J9801 Acute bronchospasm: Secondary | ICD-10-CM

## 2023-08-28 MED ORDER — METHYLPREDNISOLONE 4 MG PO TBPK: ORAL_TABLET | ORAL | 0 refills | Status: DC

## 2023-08-28 MED ORDER — PSEUDOEPH-BROMPHEN-DM 30-2-10 MG/5ML PO SYRP: 5.0000 mL | ORAL_SOLUTION | Freq: Four times a day (QID) | ORAL | 0 refills | Status: DC | PRN

## 2023-08-28 NOTE — Progress Notes (Signed)
 Still feels like lungs are struggling after having flu last week.  Cough - mostly dry - occ productive   Denies Headache, facial pain/pressure, ear discomfort, sore throat  SOB with & without activity  Nasal drainage - clear  Only taking OTC cough syrup

## 2023-08-28 NOTE — Progress Notes (Signed)
   Subjective: Cough and dyspnea    Patient ID: Cody [name], male    DOB: [birth year], 53 y.o.   MRN: [id number]  HPI Patient follow-up 1 week status post positive flu test.  Patient states nonproductive cough and dyspnea with light exertion.  Patient had taken flu shot for the season.   Review of Systems GERD, hyperlipidemia, and sleep apnea.    Objective:   Physical Exam BP 138/96BP. 138/96. Data is abnormal. Taken on 08/28/23 2:08 PM  BP Location Left Arm  Patient Position Sitting  Cuff Size Large  Pulse Rate 99  Temp 98.1 F (36.7 C)  Temp Source Oral  Resp 14  SpO2 97 %  No acute distress. Had patient walk up and down the hallway 4 times pulse ox staying at 96%. HEENT is unremarkable. Neck is supple for lymphadenopathy or bruits. Lungs clear to auscultation.  Nonproductive cough with deep inspirations. Heart regular rate and rhythm.       Assessment & Plan: Cough due to bronchospasm  Discussed no acute findings on chest x-ray.  Patient given a prescription of Medrol  Dosepak and Bromfed-DM.  Patient will follow-up in 1 week.  Is still complaining of dyspnea will perform a PFT.

## 2023-09-05 ENCOUNTER — Ambulatory Visit: Payer: Self-pay | Admitting: Physician Assistant

## 2023-09-05 ENCOUNTER — Encounter: Payer: Self-pay | Admitting: Physician Assistant

## 2023-09-05 VITALS — BP 124/93 | HR 90 | Temp 97.3°F | Resp 12

## 2023-09-05 DIAGNOSIS — J9801 Acute bronchospasm: Secondary | ICD-10-CM

## 2023-09-05 NOTE — Progress Notes (Signed)
   Subjective: Cough due to bronchospasm    Patient ID: Cody [name], male    DOB: November 12, 1970, 53 y.o.   MRN: [id number]  HPI Patient follow-up 1 weeks status post cough due to bronchospasm.  Patient finished Medrol Dosepak and continue to use Bromfed-DM.  Patient states cough has resolved.   Review of Systems . GERD and sleep apnea    Objective:   Physical Exam  BP 124/93BP. 124/93. Data is abnormal. Taken on 09/05/23 10:22 AM  BP Location Left Arm  Patient Position Sitting  Cuff Size Large  Pulse Rate 90  Temp 97.3 F (36.3 C)Temp. 97.3 F (36.3 C). Data is abnormal. Taken on 09/05/23 10:22 AM  Temp Source Temporal  Resp 12  SpO2 98 %  No acute distress. HEENT is unremarkable. Neck is supple without lymphadenopathy or bruits. Lungs are clear to auscultation. Heart regular rate and rhythm      Assessment & Plan: Cough secondary to bronchospasm

## 2023-09-05 NOTE — Progress Notes (Signed)
Completed Prednisone dose pack  States S/Sx have resolved

## 2023-09-07 ENCOUNTER — Encounter: Payer: Self-pay | Admitting: Physician Assistant

## 2023-09-07 ENCOUNTER — Other Ambulatory Visit: Payer: Self-pay | Admitting: Physician Assistant

## 2023-09-07 MED ORDER — PSEUDOEPH-BROMPHEN-DM 30-2-10 MG/5ML PO SYRP: 5.0000 mL | ORAL_SOLUTION | Freq: Four times a day (QID) | ORAL | 0 refills | Status: DC | PRN

## 2024-01-14 DIAGNOSIS — F1090 Alcohol use, unspecified, uncomplicated: Secondary | ICD-10-CM | POA: Insufficient documentation

## 2024-01-17 NOTE — Discharge Summary (Addendum)
 Physician Discharge Summary  Admit date: 01/14/2024  Discharge date and time: 01/17/2024  Discharge to: Home  Discharge Service: Med Undesignated (MDX)  Discharge Attending Physician: Ejaz Arshad Janjua, DO  Discharge Diagnoses: Acute idiopathic pancreatitis   Hospital Course:  Outpatient Provider Follow Up Issues:  - Consider follow up MRI abdomen:  tbd by GI in the outpatient setting  - Follow up IgG4, Peth (in process at the time of discharge)  Hospital Course:   Cody [name] is a 53 y.o. male with a history of pancreatitis with possible uninfected necrosis (2022), diverticulitis, GERD, HLD, and OSA, who was admitted for pancreatitis. Presented for epigastric pain with radiation to back, as well as intolerance to food, nausea, and vomiting x 1 day.   On presentation to ED patient was afebrile with stable vital signs, found to have mild leukocytosis of 11.6 and lipase of 618. CT A/P concerning for acute interstitial edematous pancreatitis, with noted areas of focal soft tissue thickening in pancreatic head as well as surrounding lymph nodes and recommendation to consider MRI after resolution of acute episode. No evidence of biliary obstruction. He was treated with IV fluids and symptom control with ketorolac, opioids, and zofran.   Patient reported prior to sudden onset of his current episode he was without any symptoms of concern, including pain concerning for biliary colic, food intolerance, excess fatigue, appetitive changes, and weight loss. Patient denied heavy alcohol use, estimating typical consumption of 1-2 beers or mixed drinks daily at most, if at all. No regular medication, OTC, or supplement use. No recent illnesses or sick contacts.   He initially tolerated po intake 06/24 AM until developing fever to 38.6 with progressively worsening pain that afternoon, leading to delayed discharge and return to NPO. Administered Ceftriaxone x 1 dose with subsequent improvement in  fever. On 06/25 he had worsening leukocytosis to 19.3 with a left shift. LFTs were normal, RPP negative. Blood cultures without growth at 24 hours. Administered second dose of Ceftriaxone as well as Metronidazole .   Notably, patient was admitted here in 06/2021 for initial episode of pancreatitis with possible early necrosis. At that time etiology was unclear, although there was some suspicion of relation to preceding COVID infection. Was seen by [facility] for further evaluation that included MRCP, and ultimately planned to follow up in case of recurrent episode.   On the day of discharge (06/26), patient was afebrile, asymptomatic off opioids, tolerating food and drink, and able to ambulate without difficulty. Discussed felt he was safe to discharge home and follow up with GI as an outpatient, which patient was agreeable to.    Antibiotics not continued upon discharge given rapid clinical improvement and low suspicion for acute necrotic collections.  Pancreatitis in of itself is likely responsible for the SIRS response.    Condition at Discharge: good Discharge Medications:    Your Medication List     START taking these medications    ibuprofen 800 MG tablet Commonly known as: MOTRIN Take 1 tablet (800 mg total) by mouth every eight (8) hours as needed for pain for up to 3 days.   oxyCODONE 10 mg immediate release tablet Commonly known as: ROXICODONE Take 1 tablet (10 mg total) by mouth every four (4) hours as needed for up to 5 days.       CONTINUE taking these medications    acetaminophen 500 MG tablet Commonly known as: TYLENOL Take 2 tablets (1,000 mg total) by mouth every eight (8) hours as needed for  pain.   tadalafil  20 MG tablet Take 1 tablet (20 mg total) by mouth daily as needed for erectile dysfunction.        Pending Test Results:   Pending Labs     Order Current Status   Immunoglobulin Subclass IgG4 In process   Phosphatidylethanol (PEth) In process   Blood  Culture Preliminary result   Blood Culture Preliminary result       Discharge Instructions:   Other Instructions     Discharge instructions     If you develop severe pain like you did the other day, please return to the ED.  Also, if you develop continuous fevers (persistently above 100.4 multiple times a day), come back to the ED.  If you notice your belly is very swollen or if you develop swelling anywhere in your body outside of the normal, return to the ED.  If you notice severe shortness of breath, return to the ED.  I have referred you to the [facility] gastroenterology team.  They can help determine if you need an MRI or not at some point.  Don't need it in the hospital though.  If you don't receive a call/message from the [facility] GI team (they will be located in [place]), please call [phone] to schedule an appointment.  Try to avoid foods that are oily, greasy, very creamy, and avoid pastries.  They may irritate the gut and subsequently the pancreas further.     Follow Up instructions and Outpatient Referrals    Discharge instructions     Ambulatory Referral to Gastroenterology     Reason for referral: recurrent pancreatitis with unknown etiology   Requested follow up plan: You would evaluate and manage.       I spent greater than 30 minutes in the discharge of this patient.

## 2024-01-28 ENCOUNTER — Ambulatory Visit: Admitting: Physician Assistant

## 2024-02-04 ENCOUNTER — Encounter: Payer: Self-pay | Admitting: Physician Assistant

## 2024-02-04 ENCOUNTER — Ambulatory Visit: Payer: Self-pay | Admitting: Physician Assistant

## 2024-02-04 VITALS — BP 140/82 | HR 74 | Temp 98.2°F | Resp 14 | Ht 69.0 in | Wt 211.0 lb

## 2024-02-04 DIAGNOSIS — K8500 Idiopathic acute pancreatitis without necrosis or infection: Secondary | ICD-10-CM

## 2024-02-04 NOTE — Progress Notes (Signed)
   Subjective: Pancreatitis    Patient ID: Cody [name], male    DOB: 11-21-1970, 53 y.o.   MRN: [id number]  HPI Patient is follow-up status post hospital admission for acute pancreatitis on 01/14/2024.  Patient was given supportive care and antibiotics while in the hospital.  Patient was discharged 3 days later and advised to follow-up with GI.  Discharged doctor from ER recommend MRI which the patient is pursuing before his GI appointment states asymptomatic at this time.  Has started diet modifications and decreased EtOH intake.  This is patient's second occurrence with a right since 2022.   Review of Systems GERD, sleep apnea, pancreatitis, and obesity.    Objective:   Physical Exam BP 140/82  Cuff Size Large  Pulse Rate 74  Temp 98.2 F (36.8 C)  Temp Source Temporal  Weight 211 lb (95.7 kg)  Height 5' 9 (1.753 m)  Resp 14  SpO2 95 %   BMI: 31.16 kg/m2  BSA: 2.16 m2  No acute distress. Examination abdomen reveals distention secondary to body habitus.  Normoactive bowel sounds.  Palpation.       Assessment & Plan: Pancreatitis  Patient advised to continue dietary restrictions and decrease EtOH.  Follow-up as scheduled GI appointment.  Return to clinic condition worsens.

## 2024-02-04 NOTE — Progress Notes (Signed)
 Pt presents today to follow up post hospitalization. Pt requesting to follow with ron smith,pa-c due to him being PCP.

## 2024-04-01 ENCOUNTER — Ambulatory Visit: Payer: Self-pay

## 2024-04-01 DIAGNOSIS — Z0289 Encounter for other administrative examinations: Secondary | ICD-10-CM

## 2024-04-01 LAB — POCT URINALYSIS DIPSTICK
Glucose, UA: NEGATIVE
Ketones, UA: NEGATIVE
Leukocytes, UA: NEGATIVE
Nitrite, UA: NEGATIVE
Protein, UA: POSITIVE — AB
Spec Grav, UA: >=1.03 — AB (ref 1.010–1.025)
Urobilinogen, UA: 0.2 U/dL
pH, UA: 5.5 (ref 5.0–8.0)

## 2024-04-01 NOTE — Progress Notes (Signed)
 PFT not administered because Cody [name] states he doesn't use a SCBA or other Respirator and no longer doesn't do fit testing for them.

## 2024-04-02 LAB — CMP12+LP+TP+TSH+6AC+PSA+CBC…
ALT: 20 IU/L (ref 0–44)
AST: 14 IU/L (ref 0–40)
Albumin: 4.1 g/dL (ref 3.8–4.9)
Alkaline Phosphatase: 94 IU/L (ref 44–121)
BUN/Creatinine Ratio: 11 (ref 9–20)
BUN: 11 mg/dL (ref 6–24)
Basophils Absolute: 0 x10E3/uL (ref 0.0–0.2)
Basos: 0 %
Bilirubin Total: 0.6 mg/dL (ref 0.0–1.2)
Calcium: 9.1 mg/dL (ref 8.7–10.2)
Chloride: 106 mmol/L (ref 96–106)
Chol/HDL Ratio: 4 ratio (ref 0.0–5.0)
Cholesterol, Total: 174 mg/dL (ref 100–199)
Creatinine, Ser: 0.99 mg/dL (ref 0.76–1.27)
EOS (ABSOLUTE): 0.1 x10E3/uL (ref 0.0–0.4)
Eos: 1 %
Estimated CHD Risk: 0.8 times avg. (ref 0.0–1.0)
Free Thyroxine Index: 2 (ref 1.2–4.9)
GGT: 16 IU/L (ref 0–65)
Globulin, Total: 2.5 g/dL (ref 1.5–4.5)
Glucose: 95 mg/dL (ref 70–99)
HDL: 43 mg/dL (ref >39)
Hematocrit: 42.7 % (ref 37.5–51.0)
Hemoglobin: 14 g/dL (ref 13.0–17.7)
Immature Grans (Abs): 0 x10E3/uL (ref 0.0–0.1)
Immature Granulocytes: 0 %
Iron: 164 ug/dL (ref 38–169)
LDH: 148 IU/L (ref 121–224)
LDL Chol Calc (NIH): 113 mg/dL — ABNORMAL HIGH (ref 0–99)
Lymphocytes Absolute: 1.4 x10E3/uL (ref 0.7–3.1)
Lymphs: 28 %
MCH: 31.4 pg (ref 26.6–33.0)
MCHC: 32.8 g/dL (ref 31.5–35.7)
MCV: 96 fL (ref 79–97)
Monocytes Absolute: 0.5 x10E3/uL (ref 0.1–0.9)
Monocytes: 10 %
Neutrophils Absolute: 3.1 x10E3/uL (ref 1.4–7.0)
Neutrophils: 61 %
Phosphorus: 2.2 mg/dL — ABNORMAL LOW (ref 2.8–4.1)
Platelets: 198 x10E3/uL (ref 150–450)
Potassium: 4.3 mmol/L (ref 3.5–5.2)
Prostate Specific Ag, Serum: 1.9 ng/mL (ref 0.0–4.0)
RBC: 4.46 x10E6/uL (ref 4.14–5.80)
RDW: 13.1 % (ref 11.6–15.4)
Sodium: 141 mmol/L (ref 134–144)
T3 Uptake Ratio: 28 % (ref 24–39)
T4, Total: 7.2 ug/dL (ref 4.5–12.0)
TSH: 1.31 u[IU]/mL (ref 0.450–4.500)
Total Protein: 6.6 g/dL (ref 6.0–8.5)
Triglycerides: 98 mg/dL (ref 0–149)
Uric Acid: 6.4 mg/dL (ref 3.8–8.4)
VLDL Cholesterol Cal: 18 mg/dL (ref 5–40)
WBC: 5.2 x10E3/uL (ref 3.4–10.8)
eGFR: 92 mL/min/1.73 (ref >59)

## 2024-04-10 ENCOUNTER — Encounter: Payer: Self-pay | Admitting: Physician Assistant

## 2024-04-10 ENCOUNTER — Ambulatory Visit: Payer: Self-pay | Admitting: Physician Assistant

## 2024-04-10 VITALS — BP 129/77 | HR 79 | Temp 97.7°F | Resp 14 | Ht 69.0 in | Wt 210.0 lb

## 2024-04-10 DIAGNOSIS — Z0000 Encounter for general adult medical examination without abnormal findings: Secondary | ICD-10-CM

## 2024-04-10 MED ORDER — FEXOFENADINE-PSEUDOEPHED ER 60-120 MG PO TB12: 1.0000 | ORAL_TABLET | Freq: Two times a day (BID) | ORAL | 0 refills | Status: AC

## 2024-04-10 MED ORDER — BENZONATATE 200 MG PO CAPS: 200.0000 mg | ORAL_CAPSULE | Freq: Three times a day (TID) | ORAL | 0 refills | Status: DC | PRN

## 2024-04-10 MED ORDER — METHYLPREDNISOLONE 4 MG PO TBPK: ORAL_TABLET | ORAL | 0 refills | Status: DC

## 2024-04-10 NOTE — Progress Notes (Signed)
 [facility] [place] [facility] ____________________________________________   None    (approximate)  I have reviewed the triage vital signs and the nursing notes.   HISTORY  Chief Complaint Employment Physical   HPI Cody [name] is a 53 y.o. male patient reports for annual physical exam focused [facility].  Patient voices concern for chronic cough status post having COVID 1 month ago.  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  Denies fever chills associated complaint.  There is no dyspnea or chest pain.         Past Medical History:  Diagnosis Date   Knee pain, left    Palpitations    Sleep apnea with use of continuous positive airway pressure (CPAP)    Vertigo     Patient Active Problem List   Diagnosis Date Noted   History of diverticulitis 06/24/2021   Pancreatitis 06/24/2021   Sensorineural hearing loss (SNHL) of both ears 11/05/2019   Abnormal posture 07/15/2019   Brachial radiculitis 07/15/2019   DDD (degenerative disc disease), cervical 07/15/2019   Muscle weakness 07/15/2019   Neck pain 07/15/2019   Elevated BP without diagnosis of hypertension 04/01/2019   Class 1 obesity due to excess calories without serious comorbidity with body mass index (BMI) of 31.0 to 31.9 in adult 04/01/2019   Sprain of ankle 04/02/2018   Sleep apnea 02/27/2014   GERD (gastroesophageal reflux disease) 02/27/2014   Hyperlipidemia 02/27/2014    Past Surgical History:  Procedure Laterality Date   INGUINAL HERNIA REPAIR Bilateral    NASAL SEPTUM SURGERY      Prior to Admission medications   Medication Sig Start Date End Date Taking? Authorizing Provider  brompheniramine-pseudoephedrine-DM 30-2-10 MG/5ML syrup Take 5 mLs by mouth 4 (four) times daily as needed. 09/07/23   Claudene Tanda POUR, PA-C  tadalafil  (CIALIS ) 20 MG tablet Take 1 tablet (20 mg total) by mouth as needed for erectile  dysfunction. 1 tab po daily prn ED, max is 1 tab per 24 hours 10/18/22   Francisca Redell BROCKS, MD  tamsulosin  (FLOMAX ) 0.4 MG CAPS capsule Take 1 capsule (0.4 mg total) by mouth daily. Patient not taking: Reported on 09/05/2023 04/18/23   Helon Kirsch A, PA-C    Allergies Amoxicillin-pot clavulanate, Levofloxacin, Nsaids, Penicillins, and Zithromax  [azithromycin ]  Family History  Problem Relation Age of Onset   Heart disease Father    COPD Father    Lung cancer Father    Bladder Cancer Brother    Diabetes Maternal Grandmother     Social History Social History   Tobacco Use   Smoking status: Former    Passive exposure: Past   Smokeless tobacco: Current    Types: Chew  Substance Use Topics   Alcohol use: Yes    Comment: socially   Drug use: Never    Review of Systems Constitutional: No fever/chills Eyes: No visual changes. ENT: No sore throat. Cardiovascular: Denies chest pain. Respiratory: Denies shortness of breath. Gastrointestinal: No abdominal pain.  No nausea, no vomiting.  No diarrhea.  No constipation. Genitourinary: Negative for dysuria. Musculoskeletal: Negative for back pain. Skin: Negative for rash. Neurological: Negative for headaches, focal weakness or numbness.:  Allergic/Immunilogical: Augmentin, levofloxacin, NSAIDs, penicillin, and Zithromax .  ____________________________________________   PHYSICAL EXAM:  VITAL SIGNS: BP 129/77  Cuff Size Large  Pulse Rate 79  Temp 97.7 F (36.5 C)  Temp Source Temporal  Weight 210 lb (95.3  kg)  Height 5' 9 (1.753 m)  Resp 14  SpO2 95 %   BMI: 31.01 kg/m2  BSA: 2.15 m2   Constitutional: Alert and oriented. Well appearing and in no acute distress. Eyes: Conjunctivae are normal. PERRL. EOMI. Head: Atraumatic. Nose: No congestion/rhinnorhea. Mouth/Throat: Mucous membranes are moist.  Oropharynx non-erythematous. Neck: No stridor.  No cervical spine tenderness to  palpation. Hematological/Lymphatic/Immunilogical: No cervical lymphadenopathy. Cardiovascular: Normal rate, regular rhythm. Grossly normal heart sounds.  Good peripheral circulation. Respiratory: Normal respiratory effort.  No retractions. Lungs CTAB. Gastrointestinal: Soft and nontender. No distention. No abdominal bruits. No CVA tenderness. Genitourinary: Deferred Musculoskeletal: No lower extremity tenderness nor edema.  No joint effusions. Neurologic:  Normal speech and language. No gross focal neurologic deficits are appreciated. No gait instability. Skin:  Skin is warm, dry and intact. No rash noted. Psychiatric: Mood and affect are normal. Speech and behavior are normal.  ____________________________________________   LABS  Ref Range & Units (hover) 9 d ago (04/01/24) 11 mo ago (04/18/23) 12 mo ago (04/12/23) 1 yr ago (04/05/23) 2 yr ago (04/06/22) 2 yr ago (06/23/21) 3 yr ago (03/22/21)  Color, UA amber Yellow R Amber Dark Yellow Orange  yellow  Clarity, UA clear  Clear Clear Clear  clear  Glucose, UA Negative  Negative Negative Negative  Negative  Bilirubin, UA 1+ Abnormal  Negative R Negative Negative Negative  negative  Ketones, UA neg Negative R Negative Negative Negative  negative  Spec Grav, UA >=1.030 Abnormal  >1.030 High  R 1.025 1.025 >=1.030 Abnormal   >=1.030 Abnormal   Blood, UA trace -+ Abnormal  1+ Abnormal  R Positive CM Positive CM Positive CM  TRACE CM  pH, UA 5.5 5.5 R 6.0 6.0 6.0  5.5  Protein, UA Positive Abnormal  Negative R Negative Negative Negative  Positive Abnormal  CM  Comment: trace -+  Urobilinogen, UA 0.2  0.2 0.2 0.2  0.2  Nitrite, UA neg Negative R Negative Negative Negative  negative  Leukocytes, UA Negative Negative Negative Negative Negative  Negative  Appearance      CLEAR R dark  Odor                   _ Order: [id number]  Status: Final result     Next appt: 05/20/2024 at 08:45 AM in Urology [facility] JAYSON Burnet, MD)     Dx: Encounter for  physical examination re...     Test Result Released: Yes (seen)   0 Result Notes           Component Ref Range & Units (hover) 9 d ago (04/01/24) 1 yr ago (04/05/23) 2 yr ago (04/06/22) 2 yr ago (08/10/21) 3 yr ago (03/22/21) 3 yr ago (04/28/20) 10 yr ago (04/22/13) 10 yr ago (04/22/13)  Glucose 95 108 High  97  133 High  R 127 High  R 137 High  R   Uric Acid 6.4 6.4 CM 7.4 CM  6.7 CM 8.1 CM    Comment:            Therapeutic target for gout patients: <6.0  BUN 11 15 13  16 15 14  R   Creatinine, Ser 0.99 1.13 1.18  1.13 1.35 High  1.25 R   eGFR 92 79 75  80     BUN/Creatinine Ratio 11 13 11  14 11     Sodium 141 139 139  140 140 138 R   Potassium 4.3 4.5 4.1  4.0 3.9 3.7 R  Chloride 106 103 102  102 103 105 R   Calcium 9.1 9.8 9.1  9.3 9.2 9.0 R   Phosphorus 2.2 Low  2.5 Low  2.4 Low   2.5 Low  3.0    Total Protein 6.6 7.3 6.8  6.5 7.2    Albumin 4.1 4.4 4.2 R  4.4 R 4.4 R    Globulin, Total 2.5 2.9 2.6  2.1 2.8    Bilirubin Total 0.6 0.4 0.7  0.5 0.6    Alkaline Phosphatase 94 99 85  83 91 CM    Comment: **Effective April 07, 2024 Alkaline Phosphatase**   reference interval will be changing to:              Age                Male          Male           0 -  5 days         47 - 127       47 - 127           6 - 10 days         29 - 242       29 - 242          11 - 20 days        109 - 357      109 - 357          21 - 30 days         94 - 494       94 - 494           1 -  2 months      149 - 539      149 - 539           3 -  6 months      131 - 452      131 - 452           7 - 11 months      117 - 401      117 - 401   12 months -  6 years       158 - 369      158 - 369           7 - 12 years       150 - 409      150 - 409               13 years       156 - 435       78 - 227               14 years       114 - 375       64 - 161               15 years        88 - 279       56 - 134               16 years        74 - 207       51 - 121               17 years        63 - 161  47 - 113          18 - 20 years        51 - 125       42 - 106          21 - 50 years        47 - 123       41 - 116          51 - 80 years        49 - 135       51 - 125              >80 years        48 - 129       48 - 129  LDH 148 138 149  136 145    AST 14 18 14  14 19     ALT 20 30 30  15 29     GGT 16 27 26  18 26     Iron 164 147 175 High   165 143    Cholesterol, Total 174 185 178  178 191    Triglycerides 98 126 95  129 149    HDL 43 42 44  52 42    VLDL Cholesterol Cal 18 23 17  23 27     LDL Chol Calc (NIH) 113 High  120 High  117 High   103 High  122 High     Chol/HDL Ratio 4.0 4.4 CM 4.0 CM  3.4 CM 4.5 CM    Comment:                                   T. Chol/HDL Ratio                                             Men  Women                               1/2 Avg.Risk  3.4    3.3                                   Avg.Risk  5.0    4.4                                2X Avg.Risk  9.6    7.1                                3X Avg.Risk 23.4   11.0  Estimated CHD Risk 0.8 0.9 CM 0.8 CM  0.5 CM 0.9 CM    Comment: The CHD Risk is based on the T. Chol/HDL ratio. Other factors affect CHD Risk such as hypertension, smoking, diabetes, severe obesity, and family history of premature CHD.  TSH 1.310 1.880 2.620  1.770 2.690    T4, Total 7.2 7.0 6.2  6.2 6.7    T3 Uptake Ratio 28 29 29  28 28     Free Thyroxine Index 2.0 2.0 1.8  1.7 1.9    Prostate  Specific Ag, Serum 1.9 3.0 CM 1.5 CM 2.2 CM 1.5 CM 2.3 CM    Comment: Roche ECLIA methodology. According to the American Urological Association, Serum PSA should decrease and remain at undetectable levels after radical prostatectomy. The AUA defines biochemical recurrence as an initial PSA value 0.2 ng/mL or greater followed by a subsequent confirmatory PSA value 0.2 ng/mL or greater. Values obtained with different assay methods or kits cannot be used interchangeably. Results cannot be interpreted as absolute evidence of the presence or  absence of malignant disease.  WBC 5.2 6.3 5.5  5.8 6.7  8.2 R  RBC 4.46 4.92 4.68  4.72 4.97  4.59 R  Hemoglobin 14.0 15.5 14.9  14.8 16.2    Hematocrit 42.7 45.4 43.7  44.2 46.4    MCV 96 92 93  94 93  92 R  MCH 31.4 31.5 31.8  31.4 32.6  31.3 R  MCHC 32.8 34.1 34.1  33.5 34.9  34.0 R  RDW 13.1 12.6 13.0  13.1 13.4  13.3 R  Platelets 198 224 206  167 197  201 R  Neutrophils 61 59 56  56 58    Lymphs 28 29 31   32 31    Monocytes 10 9 10  9 8     Eos 1 2 2  2 2     Basos 0 1 1  1 1     Neutrophils Absolute 3.1 3.8 3.1  3.3 3.9    Lymphocytes Absolute 1.4 1.8 1.7  1.8 2.1    Monocytes Absolute 0.5 0.6 0.6  0.5 0.5    EOS (ABSOLUTE) 0.1 0.1 0.1  0.1 0.1    Basophils Absolute 0.0 0.0 0.0  0.0 0.0    Immature Granulocytes 0 0 0  0 0    Immature Grans (Abs) 0.0 0.0 0.0  0.0 0.0                 ___________________________________________  EKG  Sinus rhythm at 67 bpm ____________________________________________     ____________________________________________   INITIAL IMPRESSION / ASSESSMENT AND PLAN   As part of my medical decision making, I reviewed the following data within the electronic MEDICAL RECORD NUMBER      No acute findings on physical exam, EKG, or labs.        ____________________________________________   FINAL CLINICAL IMPRESSION Well exam   ED Discharge Orders     None        Note:  This document was prepared using Dragon voice recognition software and may include unintentional dictation errors.

## 2024-04-10 NOTE — Progress Notes (Signed)
 Pt presents today to complete FF physical, Pt complains of sinus issues and cough x 1 month due to covid.

## 2024-04-28 ENCOUNTER — Telehealth: Payer: Self-pay

## 2024-04-28 NOTE — Telephone Encounter (Signed)
 Received  Physician Order form for CPAP Supplies from [facility].  [facility] [address] [place], [place]  [address] Phone:  [phone] Fax:  [phone]  CPAP Supplies Physician's Order form signed by Tanda Claudene RIGGERS & faxed back to [phone] aw requested.

## 2024-05-20 ENCOUNTER — Ambulatory Visit: Payer: Self-pay | Admitting: Urology

## 2024-05-20 VITALS — BP 121/72 | HR 91 | Ht 69.0 in | Wt 212.8 lb

## 2024-05-20 DIAGNOSIS — N529 Male erectile dysfunction, unspecified: Secondary | ICD-10-CM | POA: Diagnosis not present

## 2024-05-20 DIAGNOSIS — N401 Enlarged prostate with lower urinary tract symptoms: Secondary | ICD-10-CM | POA: Diagnosis not present

## 2024-05-20 DIAGNOSIS — N138 Other obstructive and reflux uropathy: Secondary | ICD-10-CM

## 2024-05-20 DIAGNOSIS — Z125 Encounter for screening for malignant neoplasm of prostate: Secondary | ICD-10-CM

## 2024-05-20 LAB — BLADDER SCAN AMB NON-IMAGING

## 2024-05-20 MED ORDER — TADALAFIL 10 MG PO TABS: 5.0000 mg | ORAL_TABLET | Freq: Every day | ORAL | 3 refills | Status: AC

## 2024-05-20 NOTE — Progress Notes (Signed)
   05/20/2024 8:51 AM   Cody [name] 10-Jul-1971 [id number]  Reason for visit: Follow up ED, PSA screening, BPH, history of prostatitis, history of microscopic hematuria  History: History of prostatitis January 2023 with urgency, frequency, weak stream, symptoms resolved with 10-day course of Bactrim  History of microscopic hematuria with negative workup October 2024 ED well-controlled on Cialis  as needed PSA values have been normal  Physical Exam: BP 121/72 (BP Location: Left Arm, Patient Position: Sitting, Cuff Size: Normal)   Pulse 91   Ht 5' 9 (1.753 m)   Wt 212 lb 12.8 oz (96.5 kg)   SpO2 97%   BMI 31.43 kg/m    Imaging/labs: PSA September 2025 1.9, urinalysis June 2025 benign, normal creatinine 1.06, eGFR greater than 60  Today: Denies any problems over the last year, occasional weak stream but not particularly bothersome, PVR today normal at 15ml Denies gross hematuria, dysuria, UTI Using Cialis  20 mg as needed for ED with good results   Plan:   BPH/ED: Recommend changing Cialis  to 5 mg daily to help with urination, and can take 5 to 15 mg boost dose as needed PSA screening: Reassurance provided regarding normal value of 1.9, can continue screening every 2 to 4 years Microscopic hematuria: Negative workup October 2024, recent urinalysis benign RTC 1 year IPSS, PVR   Cody JAYSON Burnet, MD  [facility] [address] [place], [place] [address] [phone]

## 2025-05-21 ENCOUNTER — Ambulatory Visit: Admitting: Urology
